# Patient Record
Sex: Male | Born: 1990
Health system: Southern US, Community
[De-identification: ages and names within clinical notes are randomized; demographics above are authoritative.]

## PROBLEM LIST (undated history)

## (undated) DIAGNOSIS — R109 Unspecified abdominal pain: Secondary | ICD-10-CM

## (undated) DIAGNOSIS — K219 Gastro-esophageal reflux disease without esophagitis: Secondary | ICD-10-CM

## (undated) DIAGNOSIS — R002 Palpitations: Secondary | ICD-10-CM

## (undated) DIAGNOSIS — R079 Chest pain, unspecified: Secondary | ICD-10-CM

## (undated) HISTORY — DX: Chest pain, unspecified: R07.9

## (undated) HISTORY — DX: Palpitations: R00.2

## (undated) HISTORY — DX: Unspecified abdominal pain: R10.9

---

## 2010-10-08 ENCOUNTER — Ambulatory Visit
Admission: RE | Admit: 2010-10-08 | Discharge: 2010-10-08 | Disposition: A | Discharge status: Home / Self Care | Payer: BC Managed Care – PPO | Source: Ambulatory Visit | Attending: Family Medicine | Admitting: Family Medicine

## 2010-10-08 ENCOUNTER — Other Ambulatory Visit: Payer: Self-pay | Admitting: Family Medicine

## 2012-08-25 ENCOUNTER — Ambulatory Visit: Payer: BC Managed Care – PPO

## 2012-08-25 ENCOUNTER — Ambulatory Visit (INDEPENDENT_AMBULATORY_CARE_PROVIDER_SITE_OTHER): Payer: BC Managed Care – PPO | Admitting: Family Medicine

## 2012-08-25 VITALS — BP 120/88 | HR 70 | Temp 98.0°F | Resp 16 | Ht 73.0 in | Wt 176.0 lb

## 2012-08-25 DIAGNOSIS — R1031 Right lower quadrant pain: Secondary | ICD-10-CM

## 2012-08-25 DIAGNOSIS — K5900 Constipation, unspecified: Secondary | ICD-10-CM

## 2012-08-25 NOTE — Progress Notes (Signed)
I have reviewed the history and physical exam in detail with Crissie Reese, student PA-C and Porfirio Oar, PA-C.  I reviewed the AAS which revealed no free air but large stool burden. Agree with assessment in plan. If pain persistent or worsens, consider CT abd/pelvis and/or referral to general surgery.

## 2012-08-25 NOTE — Progress Notes (Signed)
I have examined this patient along with the student and agree.  If pain worsens/persists, plan CT scan. Consider non-palpable hernia as cause of discomfort.

## 2012-08-25 NOTE — Progress Notes (Signed)
  Subjective:    Patient ID: Jonathan Spencer, male    DOB: Apr 15, 1990, 22 y.o.   MRN: [id number]  Chief Complaint  Patient presents with  . Abdominal Pain    right lower quadrant x 1.5 weeks    HPI Jonathan Spencer is a 22 y.o. male presenting for "conscious awareness" of gas-like feeling in RLQ x 1 month.  Patient states on July 5th - he noticed a pressure / "like something was in there" in his RLQ.  He describes spending the night before going out and having a few drinks.  For the last 1 1/2 weeks - the "awareness" feeling has been constant.  With certain movements, a sharp type pain is elicited (different from "awareness"). Worst when sitting up in a car.  Also hurts when going to sit up from laying down and when rolling over in bed.  These pains are intermittent.    Denies fever, chills, night sweats, nausea, vomiting, diarrhea, constipation, dysuria, urinary frequency.  He does admit to back pain but says this is an ongoing problem.    No major medical problems.  No surgeries.  No medications or allergies.  Review of Systems As stated in HPI - otherwise negative.    Objective:   Physical Exam Filed Vitals:   08/25/12 1812  BP: 120/88  Pulse: 70  Temp: 98 F (36.7 C)  TempSrc: Oral  Resp: 16  Height: 6\' 1"  (1.854 m)  Weight: 176 lb (79.833 kg)  SpO2: 99%   General:  WDWN male in no acute distress.   Abdomen:  Adequate bowel sounds.  Mild "soreness" to palpation in RLQ.  No masses or hernias appreciated. Genital:  No hernia or tenderness.   X-Ray ready by Dr. Nilda Simmer.  Preliminary Report:  Constipation.  No other abnormalities.    Assessment & Plan:  1. Abdominal pain, RLQ 2. Unspecified constipation  Suspect constipation is the source of abdominal pain.  Start daily miralax regimen.  Call office if symptoms do not steadily improve or any acute worsening.  Obtained:   - DG Abd Acute W/Chest

## 2012-08-25 NOTE — Patient Instructions (Signed)
Your x-rays reveal a large amount of stool on the right side of your colon that may be the cause of your discomfort and pain.   Use miralax - 1 capful in 8 oz of liquid daily. Call office if symptoms do not steadily improve or any acute worsening. You may need additional imaging or referral to specialist.

## 2012-09-13 ENCOUNTER — Ambulatory Visit (INDEPENDENT_AMBULATORY_CARE_PROVIDER_SITE_OTHER): Payer: BC Managed Care – PPO | Admitting: Family Medicine

## 2012-09-13 VITALS — BP 122/83 | HR 84 | Temp 98.6°F | Resp 16 | Ht 74.0 in | Wt 171.0 lb

## 2012-09-13 DIAGNOSIS — H6123 Impacted cerumen, bilateral: Secondary | ICD-10-CM

## 2012-09-13 DIAGNOSIS — J029 Acute pharyngitis, unspecified: Secondary | ICD-10-CM

## 2012-09-13 DIAGNOSIS — H6120 Impacted cerumen, unspecified ear: Secondary | ICD-10-CM

## 2012-09-13 LAB — POCT RAPID STREP A (OFFICE): Rapid Strep A Screen: NEGATIVE

## 2012-09-13 MED ORDER — CEFDINIR 300 MG PO CAPS: 300.0000 mg | ORAL_CAPSULE | Freq: Two times a day (BID) | ORAL | Status: DC

## 2012-09-13 NOTE — Progress Notes (Signed)
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 He's also having some nausea but no vomiting. He's had a fever and swollen glands in the neck. He has no other medical problems.  Objective: No acute distress Oropharynx: Moderately erythematous and swollen posterior pharynx, tm's bilateral cerumen impaction Skin: Unremarkable Neck: Mild anterior cervical adenopathy, supple  Results for orders placed in visit on 09/13/12  POCT RAPID STREP A (OFFICE)      Result Value Range   Rapid Strep A Screen Negative  Negative   Assessment:  Tonsillitis  Plan:  Omnicef Sore throat - Plan: POCT rapid strep A, cefdinir (OMNICEF) 300 MG capsule  Cerumen impaction, bilateral  Signed, Elvina Sidle, MD

## 2013-02-27 ENCOUNTER — Emergency Department (HOSPITAL_COMMUNITY)
Admission: EM | Admit: 2013-02-27 | Discharge: 2013-02-27 | Disposition: A | Discharge status: Home / Self Care | Payer: BC Managed Care – PPO | Attending: Emergency Medicine | Admitting: Emergency Medicine

## 2013-02-27 ENCOUNTER — Emergency Department (HOSPITAL_COMMUNITY): Payer: BC Managed Care – PPO

## 2013-02-27 ENCOUNTER — Encounter (HOSPITAL_COMMUNITY): Payer: Self-pay | Admitting: Emergency Medicine

## 2013-02-27 DIAGNOSIS — R002 Palpitations: Secondary | ICD-10-CM

## 2013-02-27 DIAGNOSIS — R079 Chest pain, unspecified: Secondary | ICD-10-CM

## 2013-02-27 DIAGNOSIS — R0789 Other chest pain: Secondary | ICD-10-CM | POA: Insufficient documentation

## 2013-02-27 DIAGNOSIS — F411 Generalized anxiety disorder: Secondary | ICD-10-CM | POA: Insufficient documentation

## 2013-02-27 LAB — BASIC METABOLIC PANEL
BUN: 11 mg/dL (ref 6–23)
CHLORIDE: 97 meq/L (ref 96–112)
CO2: 26 meq/L (ref 19–32)
CREATININE: 0.63 mg/dL (ref 0.50–1.35)
Calcium: 9.3 mg/dL (ref 8.4–10.5)
GFR calc Af Amer: >90 mL/min (ref >90)
GFR calc non Af Amer: >90 mL/min (ref >90)
Glucose, Bld: 145 mg/dL — ABNORMAL HIGH (ref 70–99)
Potassium: 3.8 mEq/L (ref 3.7–5.3)
Sodium: 137 mEq/L (ref 137–147)

## 2013-02-27 LAB — CBC WITH DIFFERENTIAL/PLATELET
BASOS ABS: 0 10*3/uL (ref 0.0–0.1)
Basophils Relative: 0 % (ref 0–1)
Eosinophils Absolute: 0.3 10*3/uL (ref 0.0–0.7)
Eosinophils Relative: 3 % (ref 0–5)
HEMATOCRIT: 42.7 % (ref 39.0–52.0)
HEMOGLOBIN: 15 g/dL (ref 13.0–17.0)
LYMPHS PCT: 25 % (ref 12–46)
Lymphs Abs: 2.7 10*3/uL (ref 0.7–4.0)
MCH: 30.7 pg (ref 26.0–34.0)
MCHC: 35.1 g/dL (ref 30.0–36.0)
MCV: 87.3 fL (ref 78.0–100.0)
MONO ABS: 0.6 10*3/uL (ref 0.1–1.0)
MONOS PCT: 6 % (ref 3–12)
NEUTROS ABS: 7.1 10*3/uL (ref 1.7–7.7)
Neutrophils Relative %: 66 % (ref 43–77)
Platelets: 246 10*3/uL (ref 150–400)
RBC: 4.89 MIL/uL (ref 4.22–5.81)
RDW: 12.8 % (ref 11.5–15.5)
WBC: 10.7 10*3/uL — AB (ref 4.0–10.5)

## 2013-02-27 LAB — POCT I-STAT TROPONIN I
TROPONIN I, POC: 0 ng/mL (ref 0.00–0.08)
Troponin i, poc: 0 ng/mL (ref 0.00–0.08)

## 2013-02-27 LAB — D-DIMER, QUANTITATIVE (NOT AT ARMC): D DIMER QUANT: 0.27 ug{FEU}/mL (ref 0.00–0.48)

## 2013-02-27 MED ORDER — SODIUM CHLORIDE 0.9 % IV BOLUS (SEPSIS)
1000.0000 mL | Freq: Once | INTRAVENOUS | Status: AC
  Administered 2013-02-27: 1000 mL via INTRAVENOUS

## 2013-02-27 NOTE — ED Provider Notes (Signed)
CSN: [id number]     Arrival date & time 02/27/13  1931 History   First MD Initiated Contact with Patient 02/27/13 1947     Chief Complaint  Patient presents with  . Chest Pain   (Consider location/radiation/quality/duration/timing/severity/associated sxs/prior Treatment) The history is provided by the patient.  Jonathan BetterBenjamin Spencer is a 23 y.o. male otherwise healthy here with chest pain. He was driving today and had substernal chest pain and no radiation. He states that it is a sense of pressure and is associated with some shortness of breath and palpitations. Denies any abdominal pain. Denies any history of blood clots. His mother had MI in the past (she was much older than he is now) but no early death in the family. He is under a lot of stress recently    History reviewed. No pertinent past medical history. History reviewed. No pertinent past surgical history. Family History  Problem Relation Age of Onset  . Heart disease Mother   . Heart disease Maternal Grandmother    History  Substance Use Topics  . Smoking status: Never Smoker   . Smokeless tobacco: Never Used  . Alcohol Use: Yes     Comment: 10-15 per week    Review of Systems  Cardiovascular: Positive for chest pain.  All other systems reviewed and are negative.    Allergies  Review of patient's allergies indicates no known allergies.  Home Medications  No current outpatient prescriptions on file. BP 151/87  Pulse 104  Temp(Src) 98.1 F (36.7 C) (Oral)  Resp 18  SpO2 98% Physical Exam  Nursing note and vitals reviewed. Constitutional: He is oriented to person, place, and time. He appears well-developed and well-nourished.  Slightly anxious   HENT:  Head: Normocephalic.  Mouth/Throat: Oropharynx is clear and moist.  Eyes: Conjunctivae are normal. Pupils are equal, round, and reactive to light.  Neck: Normal range of motion. Neck supple.  Cardiovascular: Regular rhythm and normal heart sounds.   Slightly  tachy  Pulmonary/Chest: Effort normal and breath sounds normal. No respiratory distress. He has no wheezes. He has no rales.  Abdominal: Soft. Bowel sounds are normal. He exhibits no distension. There is no tenderness. There is no rebound and no guarding.  Musculoskeletal: Normal range of motion. He exhibits no edema and no tenderness.  Neurological: He is alert and oriented to person, place, and time.  Skin: Skin is warm and dry.  Psychiatric: He has a normal mood and affect. His behavior is normal. Judgment and thought content normal.    ED Course  Procedures (including critical care time) Labs Review Labs Reviewed  CBC WITH DIFFERENTIAL - Abnormal; Notable for the following:    WBC 10.7 (*)    All other components within normal limits  BASIC METABOLIC PANEL - Abnormal; Notable for the following:    Glucose, Bld 145 (*)    All other components within normal limits  D-DIMER, QUANTITATIVE  POCT I-STAT TROPONIN I  POCT I-STAT TROPONIN I   Imaging Review Dg Chest 2 View  02/27/2013   CLINICAL DATA:  Chest pain.  EXAM: CHEST  2 VIEW  COMPARISON:  08/25/2012  FINDINGS: Lungs are clear. Cardiomediastinal silhouette and remainder of the exam is unchanged.  IMPRESSION: No active cardiopulmonary disease.   Electronically Signed   By: Elberta Fortisaniel  Boyle M.D.   On: 02/27/2013 20:12    EKG Interpretation    Date/Time:  Sunday February 27 2013 19:42:18 EST Ventricular Rate:  107 PR Interval:  126 QRS Duration: 110  QT Interval:  338 QTC Calculation: 451 R Axis:   73 Text Interpretation:  Sinus tachycardia LVH with IVCD and secondary repol abnrm Probable inferior infarct, old No previous ECGs available Confirmed by YAO  MD, DAVID [phone]) on 02/27/2013 7:54:45 PM            MDM  No diagnosis found. Jonathan Spencer is a 23 y.o. male here with chest pain, palpitations. Likely anxiety related. Low risk for ACS so trop x 2 sufficient. Tachy and slightly short of breath so will get d-dimer.  Will reassess.   10:41 PM D-dimer normal. Trop neg x 2. CXR nl. He now tells me that he takes a lot of caffeine. I recommend cut back on caffeine. If he has persistent palpitations, he can see PMD or cardiology for Holter.      Richardean Canal, MD 02/27/13 2242

## 2013-02-27 NOTE — ED Notes (Signed)
Pt arrived to the ED with a complaint of chest pain.  Pt chest pain began today.  Pt states the pain is in the central chest with no radiation.

## 2013-02-27 NOTE — ED Notes (Signed)
Dr Silverio LayYao requested 2nd Troponin level to be drawn at 2200.

## 2013-02-27 NOTE — Discharge Instructions (Signed)
Try to cut back on caffeine.   Stay hydrated.   Take tylenol or motrin for pain.   Follow up with your doctor or cardiologist if you have persistent palpitations.   Return to ER if you have severe chest pain, palpitations.

## 2013-03-01 ENCOUNTER — Encounter: Payer: Self-pay | Admitting: *Deleted

## 2013-03-01 ENCOUNTER — Encounter: Payer: Self-pay | Admitting: Cardiovascular Disease

## 2013-03-01 ENCOUNTER — Ambulatory Visit (INDEPENDENT_AMBULATORY_CARE_PROVIDER_SITE_OTHER): Payer: BC Managed Care – PPO | Admitting: Cardiovascular Disease

## 2013-03-01 VITALS — BP 135/87 | HR 82 | Ht 75.0 in | Wt 182.0 lb

## 2013-03-01 DIAGNOSIS — R079 Chest pain, unspecified: Secondary | ICD-10-CM | POA: Insufficient documentation

## 2013-03-01 DIAGNOSIS — R109 Unspecified abdominal pain: Secondary | ICD-10-CM | POA: Insufficient documentation

## 2013-03-01 DIAGNOSIS — R002 Palpitations: Secondary | ICD-10-CM | POA: Insufficient documentation

## 2013-03-01 NOTE — Progress Notes (Signed)
Patient ID: Jonathan Spencer, male   DOB: 07-Jan-1991, 23 y.o.   MRN: [id number] Jonathan Spencer is a 23 y.o. male otherwise healthy here with chest pain.  Seen in ER two days ago He was driving today and had substernal chest pain and no radiation. He states that it is a sense of pressure and is associated with some shortness of breath and palpitations. Denies any abdominal pain. Denies any history of blood clots. His mother had MI in the past (she was much older than he is now) but no early death in the family. He is under a lot of stress recently   Has a lot of caffeine and energy drinks  No drugs and social ETOH  No brothers and sisters.  Mom had SCAD.  Had initial pain around 2 while driving care EMS evaluated and thought he was ok  Went to ER latter around 6:00 for recurrence     ROS: Denies fever, malais, weight loss, blurry vision, decreased visual acuity, cough, sputum, SOB, hemoptysis, pleuritic pain, palpitaitons, heartburn, abdominal pain, melena, lower extremity edema, claudication, or rash.  All other systems reviewed and negative   General: Affect appropriate Healthy:  appears stated age HEENT: normal Neck supple with no adenopathy JVP normal no bruits no thyromegaly Lungs clear with no wheezing and good diaphragmatic motion Heart:  S1/S2 no murmur,rub, gallop or click PMI normal Abdomen: benighn, BS positve, no tenderness, no AAA no bruit.  No HSM or HJR Distal pulses intact with no bruits No edema Neuro non-focal Skin warm and dry No muscular weakness  Medications No current outpatient prescriptions on file.   No current facility-administered medications for this visit.    Allergies Review of patient's allergies indicates no known allergies.  Family History: Family History  Problem Relation Age of Onset  . Heart disease Mother   . Heart disease Maternal Grandmother     Social History: History   Social History  . Marital Status: Single    Spouse Name: N/A     Number of Children: N/A  . Years of Education: N/A   Occupational History  . car Water quality scientistsalesman     Sales, [employer]    Social History Main Topics  . Smoking status: Never Smoker   . Smokeless tobacco: Never Used  . Alcohol Use: Yes     Comment: 10-15 per week  . Drug Use: No  . Sexual Activity: Yes   Other Topics Concern  . Not on file   Social History Narrative   Lives with cousin     Electrocardiogram:  SR rate 107 normal   Assessment and Plan

## 2013-03-01 NOTE — Assessment & Plan Note (Signed)
Etiology not clear Given age and lack of risk factors not likely cardiac  F/U stress echo

## 2013-03-01 NOTE — Assessment & Plan Note (Signed)
Benign Normal ECG  Related to pain.  No preexcitation.  Observe

## 2013-03-01 NOTE — Patient Instructions (Signed)
Your physician has requested that you have a stress echocardiogram. For further information please visit [email or web address]. Please follow instruction sheet as given.  Your physician recommends that you schedule a follow-up appointment as needed.

## 2013-03-28 ENCOUNTER — Other Ambulatory Visit: Payer: Self-pay | Admitting: *Deleted

## 2013-03-28 DIAGNOSIS — R002 Palpitations: Secondary | ICD-10-CM

## 2013-03-28 DIAGNOSIS — R079 Chest pain, unspecified: Secondary | ICD-10-CM

## 2013-03-29 ENCOUNTER — Other Ambulatory Visit (HOSPITAL_COMMUNITY): Payer: BC Managed Care – PPO

## 2013-04-14 ENCOUNTER — Ambulatory Visit (INDEPENDENT_AMBULATORY_CARE_PROVIDER_SITE_OTHER): Payer: BC Managed Care – PPO | Admitting: Emergency Medicine

## 2013-04-14 VITALS — BP 124/82 | HR 79 | Temp 99.1°F | Resp 16 | Ht 74.0 in | Wt 178.2 lb

## 2013-04-14 DIAGNOSIS — R079 Chest pain, unspecified: Secondary | ICD-10-CM

## 2013-04-14 DIAGNOSIS — K2990 Gastroduodenitis, unspecified, without bleeding: Secondary | ICD-10-CM

## 2013-04-14 DIAGNOSIS — K2970 Gastritis, unspecified, without bleeding: Secondary | ICD-10-CM

## 2013-04-14 LAB — COMPREHENSIVE METABOLIC PANEL
ALK PHOS: 49 U/L (ref 39–117)
ALT: 25 U/L (ref 0–53)
AST: 23 U/L (ref 0–37)
Albumin: 4.9 g/dL (ref 3.5–5.2)
BILIRUBIN TOTAL: 0.7 mg/dL (ref 0.2–1.2)
BUN: 9 mg/dL (ref 6–23)
CALCIUM: 9.4 mg/dL (ref 8.4–10.5)
CHLORIDE: 98 meq/L (ref 96–112)
CO2: 31 mEq/L (ref 19–32)
CREATININE: 0.65 mg/dL (ref 0.50–1.35)
Glucose, Bld: 83 mg/dL (ref 70–99)
Potassium: 4.3 mEq/L (ref 3.5–5.3)
Sodium: 139 mEq/L (ref 135–145)
Total Protein: 7.7 g/dL (ref 6.0–8.3)

## 2013-04-14 LAB — POCT CBC
GRANULOCYTE PERCENT: 74.4 % (ref 37–80)
HCT, POC: 51.8 % (ref 43.5–53.7)
Hemoglobin: 17.1 g/dL (ref 14.1–18.1)
Lymph, poc: 1.5 (ref 0.6–3.4)
MCH, POC: 30.6 pg (ref 27–31.2)
MCHC: 33 g/dL (ref 31.8–35.4)
MCV: 92.9 fL (ref 80–97)
MID (CBC): 0.6 (ref 0–0.9)
MPV: 10.6 fL (ref 0–99.8)
PLATELET COUNT, POC: 278 10*3/uL (ref 142–424)
POC GRANULOCYTE: 5.9 (ref 2–6.9)
POC LYMPH PERCENT: 18.5 %L (ref 10–50)
POC MID %: 7.1 % (ref 0–12)
RBC: 5.58 M/uL (ref 4.69–6.13)
RDW, POC: 13.8 %
WBC: 7.9 10*3/uL (ref 4.6–10.2)

## 2013-04-14 LAB — TSH: TSH: 0.874 u[IU]/mL (ref 0.350–4.500)

## 2013-04-14 MED ORDER — SUCRALFATE 1 G PO TABS: ORAL_TABLET | ORAL | Status: DC

## 2013-04-14 MED ORDER — LANSOPRAZOLE 30 MG PO CPDR: 30.0000 mg | DELAYED_RELEASE_CAPSULE | Freq: Every day | ORAL | Status: DC

## 2013-04-14 NOTE — Progress Notes (Signed)
[facility] [address], [place] [address] [phone]  Date:  04/14/2013   Name:  Jonathan Spencer   DOB:  November 07, 1990   MRN:  [id number]  PCP:  Lolita PatellaEADE,ROBERT ALEXANDER, MD    Chief Complaint: Chest Pain   History of Present Illness:  Jonathan Spencer is a 23 y.o. very pleasant male patient who presents with the following:  Young man with a history of apparent caffeine induced chest pain and palpitations.  Says he had a sustained tachycardia that is not documented in the medical record of either the ED nor the cardiologist office.  He has no risk factors for CV disease.  Has since stopped taking caffeine in any form.  Now to office with recurrence of chest pain.  Says he has pains and they are located in his epigastrium and last short time to half a day.  No associated nausea or vomiting.  No food intolerance.  Today's pain started while he was sitting at his desk at work.  Non radiating.  No nausea or vomiting.  No stool change,  No shortness of breath or wheezing.  No history of PUD.  No improvement with over the counter medications or other home remedies. Denies other complaint or health concern today.   Patient Active Problem List   Diagnosis Date Noted  . Chest pain   . Abdominal pain, unspecified site   . Palpitation     Past Medical History  Diagnosis Date  . Chest pain   . Abdominal pain, unspecified site   . Palpitation     No past surgical history on file.  History  Substance Use Topics  . Smoking status: Current Some Day Smoker  . Smokeless tobacco: Never Used  . Alcohol Use: Yes     Comment: 10-15 per week    Family History  Problem Relation Age of Onset  . Heart disease Mother   . Heart disease Maternal Grandmother     No Known Allergies  Medication list has been reviewed and updated.  No current outpatient prescriptions on file prior to visit.   No current facility-administered medications on file prior to visit.     Review of Systems:  As per HPI, otherwise negative.    Physical Examination: Filed Vitals:   04/14/13 1255  BP: 124/82  Pulse: 79  Temp: 99.1 F (37.3 C)  Resp: 16   Filed Vitals:   04/14/13 1255  Height: 6\' 2"  (1.88 m)  Weight: 178 lb 3.2 oz (80.831 kg)   Body mass index is 22.87 kg/(m^2). Ideal Body Weight: Weight in (lb) to have BMI = 25: 194.3  GEN: WDWN, NAD, Non-toxic, A & O x 3 HEENT: Atraumatic, Normocephalic. Neck supple. No masses, No LAD. Ears and Nose: No external deformity. CV: RRR, No M/G/R. No JVD. No thrill. No extra heart sounds. PULM: CTA B, no wheezes, crackles, rhonchi. No retractions. No resp. distress. No accessory muscle use. ABD: S, NT, ND, +BS. No rebound. No HSM. EXTR: No c/c/e NEURO Normal gait.  PSYCH: Normally interactive. Conversant. Not depressed or anxious appearing.  Calm demeanor.    Assessment and Plan: Chest pain Gastritis Relieved pain with GI cocktail  Signed,  Phillips OdorJeffery Patrina Andreas, MD

## 2013-04-14 NOTE — Patient Instructions (Signed)

## 2013-04-15 LAB — H. PYLORI ANTIBODY, IGG

## 2013-04-26 ENCOUNTER — Other Ambulatory Visit (HOSPITAL_COMMUNITY): Payer: BC Managed Care – PPO

## 2013-04-26 ENCOUNTER — Encounter: Payer: BC Managed Care – PPO | Admitting: Nurse Practitioner

## 2013-06-24 ENCOUNTER — Ambulatory Visit (INDEPENDENT_AMBULATORY_CARE_PROVIDER_SITE_OTHER): Payer: BC Managed Care – PPO | Admitting: Family Medicine

## 2013-06-24 VITALS — BP 130/90 | HR 75 | Temp 98.2°F | Resp 16 | Ht 73.5 in | Wt 170.0 lb

## 2013-06-24 DIAGNOSIS — N4889 Other specified disorders of penis: Secondary | ICD-10-CM

## 2013-06-24 DIAGNOSIS — N489 Disorder of penis, unspecified: Secondary | ICD-10-CM

## 2013-06-24 DIAGNOSIS — K2970 Gastritis, unspecified, without bleeding: Secondary | ICD-10-CM

## 2013-06-24 LAB — POCT CBC
Granulocyte percent: 70 %G (ref 37–80)
HCT, POC: 53.5 % (ref 43.5–53.7)
HEMOGLOBIN: 17.5 g/dL (ref 14.1–18.1)
Lymph, poc: 1.5 (ref 0.6–3.4)
MCH: 30.3 pg (ref 27–31.2)
MCHC: 32.7 g/dL (ref 31.8–35.4)
MCV: 92.5 fL (ref 80–97)
MID (cbc): 0.4 (ref 0–0.9)
MPV: 9.7 fL (ref 0–99.8)
POC Granulocyte: 4.5 (ref 2–6.9)
POC LYMPH PERCENT: 23.8 %L (ref 10–50)
POC MID %: 6.2 %M (ref 0–12)
Platelet Count, POC: 249 10*3/uL (ref 142–424)
RBC: 5.78 M/uL (ref 4.69–6.13)
RDW, POC: 13.1 %
WBC: 6.4 10*3/uL (ref 4.6–10.2)

## 2013-06-24 MED ORDER — AZITHROMYCIN 250 MG PO TABS: ORAL_TABLET | ORAL | Status: DC

## 2013-06-24 NOTE — Progress Notes (Signed)
Subjective:    Patient ID: Jonathan Spencer, male    DOB: 1990/02/25, 23 y.o.   MRN: [id number]  06/24/2013  Penis issue   HPI This 23 y.o. male presents for evaluation of penile bumps.  Same partner x 4 months male partner. Noticed white patch rough on end of penis.   No itching or burning.   No pain.  No STDs.  Only male partners.  Three total partners.  No penile discharge.  No dysuria.  Applied Lotrimin to area once without improvement.    ED evaluation in past six months for epigastric and chest pain; referred to cardiology; [facility]; diagnosed with gastritis; prescribed PPI and carafate; feeling better; completed PPI course; has two remaining weeks of Carafate and plans to d/c.  Feeling well.   Review of Systems  Constitutional: Negative for fever, chills, diaphoresis and fatigue.  Genitourinary: Positive for genital sores. Negative for dysuria, urgency, frequency, discharge, penile swelling, scrotal swelling, penile pain and testicular pain.  Hematological: Negative for adenopathy.    Past Medical History  Diagnosis Date  . Chest pain   . Abdominal pain, unspecified site   . Palpitation   History reviewed. No pertinent past surgical history.  No Known Allergies Current Outpatient Prescriptions  Medication Sig Dispense Refill  . sucralfate (CARAFATE) 1 G tablet 1 tab 1 hr ac and hs  120 tablet  0  . azithromycin (ZITHROMAX) 250 MG tablet 4 tablets daily x 1 day  4 tablet  0  . lansoprazole (PREVACID) 30 MG capsule Take 1 capsule (30 mg total) by mouth daily at 12 noon.  30 capsule  5   No current facility-administered medications for this visit.   History   Social History  . Marital Status: Single    Spouse Name: N/A    Number of Children: N/A  . Years of Education: N/A   Occupational History  . car Water quality scientistsalesman     Sales, Fore[employer]    Social History Main Topics  . Smoking status: Current Some Day Smoker  . Smokeless tobacco: Never Used  .  Alcohol Use: Yes     Comment: 10-15 per week  . Drug Use: No  . Sexual Activity: Yes   Other Topics Concern  . Not on file   Social History Narrative   Lives with cousin    Sexual activity: male partners; 3 total partners; no STD hx.       Objective:    BP 130/90  Pulse 75  Temp(Src) 98.2 F (36.8 C) (Oral)  Resp 16  Ht 6' 1.5" (1.867 m)  Wt 170 lb (77.111 kg)  BMI 22.12 kg/m2  SpO2 99% Physical Exam  Nursing note and vitals reviewed. Constitutional: He appears well-developed and well-nourished. No distress.  Cardiovascular: Normal rate, regular rhythm and normal heart sounds.   No murmur heard. Pulmonary/Chest: Effort normal and breath sounds normal.  Abdominal: Hernia confirmed negative in the right inguinal area and confirmed negative in the left inguinal area.  Genitourinary: Testes normal. Right testis shows no mass, no swelling and no tenderness. Left testis shows no mass, no swelling and no tenderness. Circumcised. No penile erythema or penile tenderness. No discharge found.  Lateral aspect penis mid shaft with 8mm area of cluster of multiple follicular prominence/tiny pustules.  No ulcerative lesions; no vesicles; no erythema; no defined border; no scaling; no streaking.  Lymphadenopathy:       Right: No inguinal adenopathy present.  Left: No inguinal adenopathy present.  Skin: He is not diaphoretic.   Results for orders placed in visit on 06/24/13  POCT CBC      Result Value Ref Range   WBC 6.4  4.6 - 10.2 K/uL   Lymph, poc 1.5  0.6 - 3.4   POC LYMPH PERCENT 23.8  10 - 50 %L   MID (cbc) 0.4  0 - 0.9   POC MID % 6.2  0 - 12 %M   POC Granulocyte 4.5  2 - 6.9   Granulocyte percent 70.0  37 - 80 %G   RBC 5.78  4.69 - 6.13 M/uL   Hemoglobin 17.5  14.1 - 18.1 g/dL   HCT, POC 00.1  74.9 - 53.7 %   MCV 92.5  80 - 97 fL   MCH, POC 30.3  27 - 31.2 pg   MCHC 32.7  31.8 - 35.4 g/dL   RDW, POC 44.9     Platelet Count, POC 249  142 - 424 K/uL   MPV 9.7  0 -  99.8 fL       Assessment & Plan:  Penile lesion - Plan: POCT CBC, RPR, GC/Chlamydia Probe Amp, HIV antibody, Herpes simplex virus culture  1. Penile lesion shaft: New. Send HSV culture, RPR, GC/Chlam, HIV.  HIV counseling provided and verbal consent obtained.  Treat empirically with Zithromax.  Consider Doxycycline if studies all negative and lesion persistent. 2. Gastritis: improved.    Meds ordered this encounter  Medications  . azithromycin (ZITHROMAX) 250 MG tablet    Sig: 4 tablets daily x 1 day    Dispense:  4 tablet    Refill:  0    No Follow-up on file.   Nilda Simmer, M.D.  [facility] [address] [place], [place]  [address] [phone] phone [phone] fax

## 2013-06-25 LAB — HIV ANTIBODY (ROUTINE TESTING W REFLEX): HIV 1&2 Ab, 4th Generation: NONREACTIVE

## 2013-06-25 LAB — GC/CHLAMYDIA PROBE AMP
CT PROBE, AMP APTIMA: NEGATIVE
GC PROBE AMP APTIMA: NEGATIVE

## 2013-06-25 LAB — RPR

## 2013-06-28 LAB — HERPES SIMPLEX VIRUS CULTURE: Organism ID, Bacteria: NOT DETECTED

## 2013-06-30 MED ORDER — DOXYCYCLINE HYCLATE 100 MG PO CAPS: 100.0000 mg | ORAL_CAPSULE | Freq: Two times a day (BID) | ORAL | Status: DC

## 2013-06-30 NOTE — Addendum Note (Signed)
Addended by: Ethelda Chick on: 06/30/2013 07:45 PM   Modules accepted: Orders

## 2013-08-03 ENCOUNTER — Ambulatory Visit (INDEPENDENT_AMBULATORY_CARE_PROVIDER_SITE_OTHER): Payer: BC Managed Care – PPO | Admitting: Family Medicine

## 2013-08-03 ENCOUNTER — Ambulatory Visit (INDEPENDENT_AMBULATORY_CARE_PROVIDER_SITE_OTHER): Payer: BC Managed Care – PPO

## 2013-08-03 VITALS — BP 134/83 | HR 71 | Temp 97.7°F | Resp 18 | Ht 74.0 in | Wt 176.0 lb

## 2013-08-03 DIAGNOSIS — M79641 Pain in right hand: Secondary | ICD-10-CM

## 2013-08-03 DIAGNOSIS — M79609 Pain in unspecified limb: Secondary | ICD-10-CM

## 2013-08-03 DIAGNOSIS — S6010XA Contusion of unspecified finger with damage to nail, initial encounter: Secondary | ICD-10-CM

## 2013-08-03 DIAGNOSIS — S6000XA Contusion of unspecified finger without damage to nail, initial encounter: Secondary | ICD-10-CM

## 2013-08-03 NOTE — Progress Notes (Signed)
[facility] [address], [place] [address] [phone]  Date:  08/03/2013   Name:  Jonathan Spencer   DOB:  1990-05-11   MRN:  [id number]  PCP:  Lolita PatellaEADE,ROBERT ALEXANDER, MD    Chief Complaint: Hand Injury   History of Present Illness:  Jonathan Spencer is a 23 y.o. very pleasant male patient who presents with the following:  Generally healthy young man here today with an injury to his right thumb.  He shut it in a car door a couple of days ago. Otherwise he is doing well today and has no other injury or concern. The door "shut completely" on his distal thumb.  The thumb is painful and the nail has a sugungal hematoma which he would like to drain if possible.  He is right handed  Patient Active Problem List   Diagnosis Date Noted  . Chest pain   . Abdominal pain, unspecified site   . Palpitation     Past Medical History  Diagnosis Date  . Chest pain   . Abdominal pain, unspecified site   . Palpitation     History reviewed. No pertinent past surgical history.  History  Substance Use Topics  . Smoking status: Current Some Day Smoker  . Smokeless tobacco: Never Used  . Alcohol Use: Yes     Comment: 10-15 per week    Family History  Problem Relation Age of Onset  . Heart disease Mother   . Heart disease Maternal Grandmother     No Known Allergies  Medication list has been reviewed and updated.  Current Outpatient Prescriptions on File Prior to Visit  Medication Sig Dispense Refill  . lansoprazole (PREVACID) 30 MG capsule Take 1 capsule (30 mg total) by mouth daily at 12 noon.  30 capsule  5  . azithromycin (ZITHROMAX) 250 MG tablet 4 tablets daily x 1 day  4 tablet  0  . doxycycline (VIBRAMYCIN) 100 MG capsule Take 1 capsule (100 mg total) by mouth 2 (two) times daily.  20 capsule  0  . sucralfate (CARAFATE) 1 G tablet 1 tab 1 hr ac and hs  120 tablet  0   No current facility-administered medications on file prior to visit.    Review  of Systems:  As per HPI- otherwise negative.   Physical Examination: Filed Vitals:   08/03/13 0826  BP: 134/83  Pulse: 71  Temp: 97.7 F (36.5 C)  Resp: 18   Filed Vitals:   08/03/13 0826  Height: 6\' 2"  (1.88 m)  Weight: 176 lb (79.833 kg)   Body mass index is 22.59 kg/(m^2). Ideal Body Weight: Weight in (lb) to have BMI = 25: 194.3  GEN: WDWN, NAD, Non-toxic, A & O x 3, looks well HEENT: Atraumatic, Normocephalic. Neck supple. No masses, No LAD. Ears and Nose: No external deformity. CV: RRR, No M/G/R. No JVD. No thrill. No extra heart sounds. PULM: CTA B, no wheezes, crackles, rhonchi. No retractions. No resp. distress. No accessory muscle use. EXTR: No c/c/e NEURO Normal gait.  PSYCH: Normally interactive. Conversant. Not depressed or anxious appearing.  Calm demeanor.  Right hand; the distal phalanx of the thumb is contused and there is a 90% subungual hematoma of the nail.  Movement of the IP joint is uncomfortable.  No laceration   [facility] reading (PRIMARY) by  Dr. Patsy Lageropland. Right hand: no fracture RIGHT HAND - COMPLETE 3+ VIEW  COMPARISON: None.  FINDINGS: There is no evidence of fracture or dislocation.  There is no evidence of arthropathy or other focal bone abnormality. Soft tissues are unremarkable.  IMPRESSION: Negative.  VC obtained. Nail prepped with betadine, electocautery used to evacuate hematoma from under nail.  Bandaged and placed in a fold- over splint for protection  Assessment and Plan: Right hand pain - Plan: DG Hand Complete Right  Subungual hematoma of digit of hand, initial encounter  Treated with evacuation as above, splint for protection. He will follow-up as needed.  Counseled that nail may become loose and troublesome- if he would like to have it removed we are glad to do this for him as needed  Signed Abbe AmsterdamJessica Lesli Issa, MD

## 2013-08-03 NOTE — Patient Instructions (Signed)
Keep your finger bandaged and splinted until tomorrow.  You can then wash your hand as you normally would, pat dry and keep covered. Use the splint as needed for a few days to protect the nail for further trauma.  Your nail may or may not eventually come off.  If it is getting loose and you want it removed come back and we can do this for you.  However for now I would try and keep it in place to protect the nail bed and allow a new nail to grow.   If you have any problems let us know!

## 2013-09-05 ENCOUNTER — Ambulatory Visit (INDEPENDENT_AMBULATORY_CARE_PROVIDER_SITE_OTHER): Payer: BC Managed Care – PPO | Admitting: Family Medicine

## 2013-09-05 VITALS — BP 132/90 | HR 74 | Temp 98.3°F | Resp 20 | Ht 74.0 in | Wt 170.0 lb

## 2013-09-05 DIAGNOSIS — Z8719 Personal history of other diseases of the digestive system: Secondary | ICD-10-CM

## 2013-09-05 DIAGNOSIS — F411 Generalized anxiety disorder: Secondary | ICD-10-CM

## 2013-09-05 DIAGNOSIS — R079 Chest pain, unspecified: Secondary | ICD-10-CM

## 2013-09-05 MED ORDER — ALPRAZOLAM 0.25 MG PO TABS
0.2500 mg | ORAL_TABLET | Freq: Once | ORAL | Status: AC
  Administered 2013-09-05: 0.25 mg via ORAL

## 2013-09-05 MED ORDER — ALPRAZOLAM 0.25 MG PO TABS: ORAL_TABLET | ORAL | Status: DC

## 2013-09-05 NOTE — Progress Notes (Signed)
Subjective: Patient was at work at the [employer] where he works. He was inside on the phone. He was not typically stressed or anxious. He started having a tingly sensation in his chest, maybe a little fluttery. He has a history of having some little palpitations from time to time. He's been evaluated in the past for chest pain with an EKG which is normal. He did not have any vomiting. He did have a clamminess and sweatiness of his hands and numbness of his hands. Was breathing a little short.  Last evaluated about 4 months ago for similar and was normal. Has not had any tests other than just an EKG. He has a history of GERD, and the frequency of that discomfort has diminished with taking medication.  Objective: Anxious but no acute distress. He was flipping his phone when I saw him. Neck supple and nontender. Chest clear. Heart regular without murmurs.  Assessment: Chest pain, somewhat atypical  Plan: EKG normal  Discuss his symptoms with him a little bit further. He feels that once he starts feeling something in his chest he thinks he may get more anxious. He really wasn't having any great anxiety when it hit a day however. I am going to go ahead and give him 25 of Xanax. I think he does need to see his cardiologist back. He never did get the stress echo done that was previously ordered. I am going to recommend that he get evaluated a little bit further to be at a higher level of confidence that he is not having any problems from his heart, but this does not seem cardiac at this time.

## 2013-09-05 NOTE — Patient Instructions (Signed)
If you start having the palpitations sensation in your care or tingling there take a alprazolam and try to relax.  If symptoms are getting worse see a physician promptly or call 911 if needed.  Referral will be made back to your cardiologist. If you call the office and get yourself an appointment before you hear from us please call back and speak to the referrals secretary and inform them that you already have an appointment.

## 2013-09-06 ENCOUNTER — Encounter: Payer: Self-pay | Admitting: Family Medicine

## 2013-11-02 ENCOUNTER — Institutional Professional Consult (permissible substitution): Payer: BC Managed Care – PPO | Admitting: Cardiovascular Disease

## 2013-11-11 ENCOUNTER — Telehealth: Payer: Self-pay | Admitting: Family Medicine

## 2013-11-11 NOTE — Telephone Encounter (Signed)
We received the referral for Mr. Jonathan Spencer to see Dr. Eden EmmsNishan.  He was schedule 11-02-13 and he no showed for the appointment.  Spoke with him today and he don't wish to see us at this time.

## 2013-11-16 ENCOUNTER — Encounter: Payer: Self-pay | Admitting: Cardiovascular Disease

## 2014-05-11 ENCOUNTER — Ambulatory Visit (INDEPENDENT_AMBULATORY_CARE_PROVIDER_SITE_OTHER): Payer: Managed Care, Other (non HMO) | Admitting: Family Medicine

## 2014-05-11 VITALS — BP 118/74 | HR 77 | Temp 98.3°F | Resp 16 | Ht 73.5 in | Wt 194.0 lb

## 2014-05-11 DIAGNOSIS — R1033 Periumbilical pain: Secondary | ICD-10-CM | POA: Diagnosis not present

## 2014-05-11 DIAGNOSIS — R1031 Right lower quadrant pain: Secondary | ICD-10-CM

## 2014-05-11 DIAGNOSIS — R8281 Pyuria: Secondary | ICD-10-CM

## 2014-05-11 DIAGNOSIS — N390 Urinary tract infection, site not specified: Secondary | ICD-10-CM | POA: Diagnosis not present

## 2014-05-11 DIAGNOSIS — R1011 Right upper quadrant pain: Secondary | ICD-10-CM | POA: Diagnosis not present

## 2014-05-11 LAB — POCT URINALYSIS DIPSTICK
Bilirubin, UA: NEGATIVE
Glucose, UA: NEGATIVE
Ketones, UA: NEGATIVE
NITRITE UA: NEGATIVE
PROTEIN UA: NEGATIVE
RBC UA: NEGATIVE
Spec Grav, UA: 1.015
UROBILINOGEN UA: 0.2
pH, UA: 8

## 2014-05-11 LAB — POCT UA - MICROSCOPIC ONLY
BACTERIA, U MICROSCOPIC: NEGATIVE
CRYSTALS, UR, HPF, POC: NEGATIVE
Casts, Ur, LPF, POC: NEGATIVE
Mucus, UA: NEGATIVE
YEAST UA: NEGATIVE

## 2014-05-11 LAB — POCT CBC
GRANULOCYTE PERCENT: 70.8 % (ref 37–80)
HCT, POC: 50.2 % (ref 43.5–53.7)
Hemoglobin: 16.7 g/dL (ref 14.1–18.1)
Lymph, poc: 2 (ref 0.6–3.4)
MCH, POC: 28.8 pg (ref 27–31.2)
MCHC: 33.3 g/dL (ref 31.8–35.4)
MCV: 86.3 fL (ref 80–97)
MID (CBC): 0.6 (ref 0–0.9)
MPV: 8.3 fL (ref 0–99.8)
POC Granulocyte: 6.3 (ref 2–6.9)
POC LYMPH %: 22.9 % (ref 10–50)
POC MID %: 6.3 %M (ref 0–12)
Platelet Count, POC: 218 10*3/uL (ref 142–424)
RBC: 5.81 M/uL (ref 4.69–6.13)
RDW, POC: 13.3 %
WBC: 8.9 10*3/uL (ref 4.6–10.2)

## 2014-05-11 NOTE — Progress Notes (Addendum)
Subjective:  This chart was scribed for Meredith Staggers, MD by Richarda Overlie, Medical scribe. This patient was seen in ROOM 4 and the patient's care was started 10:30 AM.  Patient ID: Jonathan Spencer, male    DOB: 17-Jan-1991, 24 y.o.   MRN: [id number]   Chief Complaint  Patient presents with  . Abdominal Pain    sun pm   HPI  HPI Comments: Jonathan Spencer is a 24 y.o. male with a history of CP, GERD, abdominal pain and palpitation who presents to [facility] complaining of abdominal pain that started yesterday at 4PM. He states he has a sharp pain over his belly button and has intermittent soreness in his RLQ. He reports that he experienced some sharp pain in his RLQ last night but says it is currently a dull pain. Pt states that his pain is not present at rest but presents with movement. He states his last BM was yesterday morning and was normal with no pain. Pt denies any recent international travel. He denies eating any undercooked foods recently. Pt reports no history of kidney stones or STDs. He states his last meal was last night at 8PM. Pt denies diarrhea, nausea, vomiting, blood in his stool, penile rash or discharge.   Patient Active Problem List   Diagnosis Date Noted  . Chest pain   . Abdominal pain, unspecified site   . Palpitation    Past Medical History  Diagnosis Date  . Chest pain   . Abdominal pain, unspecified site   . Palpitation    History reviewed. No pertinent past surgical history. No Known Allergies Prior to Admission medications   Medication Sig Start Date End Date Taking? Authorizing Provider  ALPRAZolam Prudy Feeler) 0.25 MG tablet Take one twice daily only if needed for palpitations or extreme anxiety. Patient not taking: Reported on 05/11/2014 09/05/13   Peyton Najjar, MD  lansoprazole (PREVACID) 30 MG capsule Take 1 capsule (30 mg total) by mouth daily at 12 noon. Patient not taking: Reported on 05/11/2014 04/14/13   Carmelina Dane, MD  sucralfate (CARAFATE) 1 G  tablet 1 tab 1 hr ac and hs Patient not taking: Reported on 05/11/2014 04/14/13   Carmelina Dane, MD   History   Social History  . Marital Status: Single    Spouse Name: N/A  . Number of Children: N/A  . Years of Education: N/A   Occupational History  . car Water quality scientist, Forensic psychologist    Social History Main Topics  . Smoking status: Former Games developer  . Smokeless tobacco: Never Used  . Alcohol Use: 0.0 oz/week    0 Standard drinks or equivalent per week     Comment: 10-15 per week  . Drug Use: No  . Sexual Activity: Yes   Other Topics Concern  . Not on file   Social History Narrative   Lives with cousin    Sexual activity: male partners; 3 total partners; no STD hx.   Review of Systems  Gastrointestinal: Positive for abdominal pain. Negative for nausea, vomiting, diarrhea, constipation and blood in stool.  Genitourinary: Negative for discharge and penile swelling.      Objective:   Physical Exam  Constitutional: He is oriented to person, place, and time. He appears well-developed and well-nourished.  HENT:  Head: Normocephalic and atraumatic.  Eyes: Right eye exhibits no discharge. Left eye exhibits no discharge.  Neck: Neck supple. No tracheal deviation present.  Cardiovascular: Normal rate.   Pulmonary/Chest:  Effort normal. No respiratory distress.  Abdominal: Soft. He exhibits no distension. There is tenderness. There is no rebound and no guarding.  Most tender at periumbilical, slightly tender in RLQ most at McBurney's point. Negative heel jar. Negative Murphy's sign.   Neurological: He is alert and oriented to person, place, and time.  Skin: Skin is warm and dry.  Psychiatric: He has a normal mood and affect. His behavior is normal.  Nursing note and vitals reviewed.  Filed Vitals:   05/11/14 0918  BP: 118/74  Pulse: 77  Temp: 98.3 F (36.8 C)  TempSrc: Oral  Resp: 16  Height: 6' 1.5" (1.867 m)  Weight: 194 lb (87.998 kg)  SpO2: 97%    Results for orders placed or performed in visit on 05/11/14  POCT CBC  Result Value Ref Range   WBC 8.9 4.6 - 10.2 K/uL   Lymph, poc 2.0 0.6 - 3.4   POC LYMPH PERCENT 22.9 10 - 50 %L   MID (cbc) 0.6 0 - 0.9   POC MID % 6.3 0 - 12 %M   POC Granulocyte 6.3 2 - 6.9   Granulocyte percent 70.8 37 - 80 %G   RBC 5.81 4.69 - 6.13 M/uL   Hemoglobin 16.7 14.1 - 18.1 g/dL   HCT, POC 78.2 95.6 - 53.7 %   MCV 86.3 80 - 97 fL   MCH, POC 28.8 27 - 31.2 pg   MCHC 33.3 31.8 - 35.4 g/dL   RDW, POC 21.3 %   Platelet Count, POC 218 142 - 424 K/uL   MPV 8.3 0 - 99.8 fL  POCT UA - Microscopic Only  Result Value Ref Range   WBC, Ur, HPF, POC 0-3    RBC, urine, microscopic 0-1    Bacteria, U Microscopic neg    Mucus, UA neg    Epithelial cells, urine per micros 0-1    Crystals, Ur, HPF, POC neg    Casts, Ur, LPF, POC neg    Yeast, UA neg    Renal tubular cells 0-1   POCT urinalysis dipstick  Result Value Ref Range   Color, UA yellow    Clarity, UA clear    Glucose, UA neg    Bilirubin, UA neg    Ketones, UA neg    Spec Grav, UA 1.015    Blood, UA neg    pH, UA 8.0    Protein, UA neg    Urobilinogen, UA 0.2    Nitrite, UA neg    Leukocytes, UA Trace        Assessment & Plan:  Jonathan Spencer is a 24 y.o. male Abdominal pain, RLQ - Plan: POCT CBC, POCT UA - Microscopic Only, POCT urinalysis dipstick, Urine culture  Umbilical pain - Plan: Urine culture  Pyuria - Plan: PSA, Urine culture   -Less than 24 hours of periumbilical abd pain with some radiation to RLQ. Min ttp on exam, afebrile, no N/V, no leukocytosis at this point. Discussed options of eval with CT, but decided to watch for 24 hours and recheck exam and CBC at that time. If increasing pain overnight - advised to go to ER.   -psa and urine cx obtained with few WBC on micro and trace LE, but no urinary sx's.  abx's deferred at this time.   -rtc precautions.    No orders of the defined types were placed in this  encounter.   Patient Instructions  We will check a prostate test and other urine test  for infection. Blood counts are normal today - less likelyappendicitis, but due to area of soreness - return for recheck tomorrow morning. Return to the clinic or go to the nearest emergency room if any of your symptoms worsen or new symptoms occur.  Abdominal Pain Many things can cause abdominal pain. Usually, abdominal pain is not caused by a disease and will improve without treatment. It can often be observed and treated at home. Your health care provider will do a physical exam and possibly order blood tests and X-rays to help determine the seriousness of your pain. However, in many cases, more time must pass before a clear cause of the pain can be found. Before that point, your health care provider may not know if you need more testing or further treatment. HOME CARE INSTRUCTIONS  Monitor your abdominal pain for any changes. The following actions may help to alleviate any discomfort you are experiencing:  Only take over-the-counter or prescription medicines as directed by your health care provider.  Do not take laxatives unless directed to do so by your health care provider.  Try a clear liquid diet (broth, tea, or water) as directed by your health care provider. Slowly move to a bland diet as tolerated. SEEK MEDICAL CARE IF:  You have unexplained abdominal pain.  You have abdominal pain associated with nausea or diarrhea.  You have pain when you urinate or have a bowel movement.  You experience abdominal pain that wakes you in the night.  You have abdominal pain that is worsened or improved by eating food.  You have abdominal pain that is worsened with eating fatty foods.  You have a fever. SEEK IMMEDIATE MEDICAL CARE IF:   Your pain does not go away within 2 hours.  You keep throwing up (vomiting).  Your pain is felt only in portions of the abdomen, such as the right side or the left lower  portion of the abdomen.  You pass bloody or black tarry stools. MAKE SURE YOU:  Understand these instructions.   Will watch your condition.   Will get help right away if you are not doing well or get worse.  Document Released: 10/30/2004 Document Revised: 01/25/2013 Document Reviewed: 09/29/2012 [facility] Patient Information 2015 [place], [place]. This information is not intended to replace advice given to you by your health care provider. Make sure you discuss any questions you have with your health care provider.     I personally performed the services described in this documentation, which was scribed in my presence. The recorded information has been reviewed and considered, and addended by me as needed.

## 2014-05-11 NOTE — Patient Instructions (Addendum)
We will check a prostate test and other urine test for infection. Blood counts are normal today - less likelyappendicitis, but due to area of soreness - return for recheck tomorrow morning. Return to the clinic or go to the nearest emergency room if any of your symptoms worsen or new symptoms occur.  Abdominal Pain Many things can cause abdominal pain. Usually, abdominal pain is not caused by a disease and will improve without treatment. It can often be observed and treated at home. Your health care provider will do a physical exam and possibly order blood tests and X-rays to help determine the seriousness of your pain. However, in many cases, more time must pass before a clear cause of the pain can be found. Before that point, your health care provider may not know if you need more testing or further treatment. HOME CARE INSTRUCTIONS  Monitor your abdominal pain for any changes. The following actions may help to alleviate any discomfort you are experiencing:  Only take over-the-counter or prescription medicines as directed by your health care provider.  Do not take laxatives unless directed to do so by your health care provider.  Try a clear liquid diet (broth, tea, or water) as directed by your health care provider. Slowly move to a bland diet as tolerated. SEEK MEDICAL CARE IF:  You have unexplained abdominal pain.  You have abdominal pain associated with nausea or diarrhea.  You have pain when you urinate or have a bowel movement.  You experience abdominal pain that wakes you in the night.  You have abdominal pain that is worsened or improved by eating food.  You have abdominal pain that is worsened with eating fatty foods.  You have a fever. SEEK IMMEDIATE MEDICAL CARE IF:   Your pain does not go away within 2 hours.  You keep throwing up (vomiting).  Your pain is felt only in portions of the abdomen, such as the right side or the left lower portion of the abdomen.  You pass  bloody or black tarry stools. MAKE SURE YOU:  Understand these instructions.   Will watch your condition.   Will get help right away if you are not doing well or get worse.  Document Released: 10/30/2004 Document Revised: 01/25/2013 Document Reviewed: 09/29/2012 [facility]ExitCare Patient Information 2015 [place], [place]. This information is not intended to replace advice given to you by your health care provider. Make sure you discuss any questions you have with your health care provider.

## 2014-05-12 LAB — URINE CULTURE: Colony Count: 10000

## 2014-05-12 LAB — PSA: PSA: 0.38 ng/mL (ref <=4.00)

## 2014-05-14 ENCOUNTER — Encounter (HOSPITAL_BASED_OUTPATIENT_CLINIC_OR_DEPARTMENT_OTHER): Payer: Self-pay | Admitting: *Deleted

## 2014-05-14 ENCOUNTER — Emergency Department (HOSPITAL_BASED_OUTPATIENT_CLINIC_OR_DEPARTMENT_OTHER)
Admission: EM | Admit: 2014-05-14 | Discharge: 2014-05-15 | Disposition: A | Discharge status: Home / Self Care | Payer: Managed Care, Other (non HMO) | Attending: Emergency Medicine | Admitting: Emergency Medicine

## 2014-05-14 DIAGNOSIS — K5904 Chronic idiopathic constipation: Secondary | ICD-10-CM

## 2014-05-14 DIAGNOSIS — K219 Gastro-esophageal reflux disease without esophagitis: Secondary | ICD-10-CM | POA: Diagnosis not present

## 2014-05-14 DIAGNOSIS — Z79899 Other long term (current) drug therapy: Secondary | ICD-10-CM | POA: Diagnosis not present

## 2014-05-14 DIAGNOSIS — Z87891 Personal history of nicotine dependence: Secondary | ICD-10-CM | POA: Diagnosis not present

## 2014-05-14 DIAGNOSIS — R110 Nausea: Secondary | ICD-10-CM | POA: Diagnosis not present

## 2014-05-14 DIAGNOSIS — R109 Unspecified abdominal pain: Secondary | ICD-10-CM | POA: Diagnosis present

## 2014-05-14 DIAGNOSIS — K5900 Constipation, unspecified: Secondary | ICD-10-CM | POA: Insufficient documentation

## 2014-05-14 HISTORY — DX: Gastro-esophageal reflux disease without esophagitis: K21.9

## 2014-05-14 LAB — URINALYSIS, ROUTINE W REFLEX MICROSCOPIC
Bilirubin Urine: NEGATIVE
Glucose, UA: NEGATIVE mg/dL
HGB URINE DIPSTICK: NEGATIVE
Ketones, ur: NEGATIVE mg/dL
LEUKOCYTES UA: NEGATIVE
NITRITE: NEGATIVE
Protein, ur: NEGATIVE mg/dL
Specific Gravity, Urine: 1.017 (ref 1.005–1.030)
Urobilinogen, UA: 0.2 mg/dL (ref 0.0–1.0)
pH: 6.5 (ref 5.0–8.0)

## 2014-05-14 NOTE — ED Provider Notes (Signed)
CSN: [id number]     Arrival date & time 05/14/14  2236 History  This chart was scribed for Tanyia Grabbe, MD by Tonye Royalty, ED Scribe. This patient was seen in room MH07/MH07 and the patient's care was started at 12:45 AM.    Chief Complaint  Patient presents with  . Abdominal Pain   Patient is a 24 y.o. male presenting with abdominal pain. The history is provided by the patient. No language interpreter was used.  Abdominal Pain Pain location:  R flank Pain quality: sharp   Pain severity:  Moderate Onset quality:  Sudden Duration:  2 days Timing:  Constant Progression:  Waxing and waning Chronicity:  New Context: not alcohol use (last 1 week ago) and not sick contacts   Relieved by:  Nothing Worsened by:  Eating and position changes Ineffective treatments:  None tried Associated symptoms: nausea   Associated symptoms: no chest pain, no constipation, no diarrhea, no fever and no vomiting   Risk factors: has not had multiple surgeries     HPI Comments: Child Campoy is a 23 y.o. male who presents to the Emergency Department complaining of right flank (mix axillary line) onset 2 days ago. He states pain is waxing and waning. He states it is worse after eating and with movement. He was seen at urgent care where he had UA and blood work that revealed some WBC in urine and did not receive a diagnosis. He denies new exercise regimen or particular exertion. Significant other states he has had associated nausea. He has history of GERD but is not compliant with prescription of Prilosec. He uses alcohol occasionally, last 1 week ago. He does not smoke but uses chewing tobacco. He denies diarrhea or constipation.   Past Medical History  Diagnosis Date  . Chest pain   . Abdominal pain, unspecified site   . Palpitation   . Acid reflux    History reviewed. No pertinent past surgical history. Family History  Problem Relation Age of Onset  . Heart disease Mother   . Heart disease Maternal  Grandmother    History  Substance Use Topics  . Smoking status: Former Games developer  . Smokeless tobacco: Current User    Types: Snuff  . Alcohol Use: 0.0 oz/week    0 Standard drinks or equivalent per week     Comment: 10-15 per week    Review of Systems  Constitutional: Negative for fever.  Cardiovascular: Negative for chest pain.  Gastrointestinal: Positive for nausea and abdominal pain. Negative for vomiting, diarrhea and constipation.  All other systems reviewed and are negative.     Allergies  Review of patient's allergies indicates no known allergies.  Home Medications   Prior to Admission medications   Medication Sig Start Date End Date Taking? Authorizing Provider  omeprazole (PRILOSEC OTC) 20 MG tablet Take 20 mg by mouth daily.   Yes Historical Provider, MD   There were no vitals taken for this visit. Physical Exam  Constitutional: He is oriented to person, place, and time. He appears well-developed and well-nourished.  HENT:  Head: Normocephalic and atraumatic.  Mouth/Throat: Oropharynx is clear and moist. No oropharyngeal exudate.  Eyes: Conjunctivae and EOM are normal. Pupils are equal, round, and reactive to light.  Neck: Normal range of motion. Neck supple.  Cardiovascular: Normal rate and regular rhythm.   Pulmonary/Chest: Effort normal and breath sounds normal. No respiratory distress. He has no wheezes. He has no rales.  Abdominal: Soft. He exhibits no distension and no  mass. There is no hepatosplenomegaly. There is no tenderness. There is no rigidity, no rebound, no guarding, no tenderness at McBurney's point and negative Murphy's sign. No hernia.    Exceptionally gassy everywhere   Musculoskeletal: Normal range of motion.  Neurological: He is alert and oriented to person, place, and time.  Skin: Skin is warm and dry.  Psychiatric: He has a normal mood and affect.  Nursing note and vitals reviewed.   ED Course  Procedures (including critical care  time)  DIAGNOSTIC STUDIES: Oxygen Saturation is 97% on room air, normal by my interpretation.    COORDINATION OF CARE: 12:50 AM Discussed treatment plan with patient at beside, the patient agrees with the plan and has no further questions at this time.   Labs Review Labs Reviewed  URINALYSIS, ROUTINE W REFLEX MICROSCOPIC    Imaging Review No results found.   EKG Interpretation None      MDM   Final diagnoses:  None    Results for orders placed or performed during the hospital encounter of 05/14/14  Urinalysis, Routine w reflex microscopic  Result Value Ref Range   Color, Urine YELLOW YELLOW   APPearance CLEAR CLEAR   Specific Gravity, Urine 1.017 1.005 - 1.030   pH 6.5 5.0 - 8.0   Glucose, UA NEGATIVE NEGATIVE mg/dL   Hgb urine dipstick NEGATIVE NEGATIVE   Bilirubin Urine NEGATIVE NEGATIVE   Ketones, ur NEGATIVE NEGATIVE mg/dL   Protein, ur NEGATIVE NEGATIVE mg/dL   Urobilinogen, UA 0.2 0.0 - 1.0 mg/dL   Nitrite NEGATIVE NEGATIVE   Leukocytes, UA NEGATIVE NEGATIVE  CBC with Differential/Platelet  Result Value Ref Range   WBC 9.2 4.0 - 10.5 K/uL   RBC 5.31 4.22 - 5.81 MIL/uL   Hemoglobin 15.3 13.0 - 17.0 g/dL   HCT 40.9 81.1 - 91.4 %   MCV 86.3 78.0 - 100.0 fL   MCH 28.8 26.0 - 34.0 pg   MCHC 33.4 30.0 - 36.0 g/dL   RDW 78.2 95.6 - 21.3 %   Platelets 221 150 - 400 K/uL   Neutrophils Relative % 54 43 - 77 %   Neutro Abs 5.0 1.7 - 7.7 K/uL   Lymphocytes Relative 32 12 - 46 %   Lymphs Abs 2.9 0.7 - 4.0 K/uL   Monocytes Relative 8 3 - 12 %   Monocytes Absolute 0.8 0.1 - 1.0 K/uL   Eosinophils Relative 5 0 - 5 %   Eosinophils Absolute 0.4 0.0 - 0.7 K/uL   Basophils Relative 1 0 - 1 %   Basophils Absolute 0.1 0.0 - 0.1 K/uL  Comprehensive metabolic panel  Result Value Ref Range   Sodium 139 135 - 145 mmol/L   Potassium 3.5 3.5 - 5.1 mmol/L   Chloride 104 96 - 112 mmol/L   CO2 27 19 - 32 mmol/L   Glucose, Bld 120 (H) 70 - 99 mg/dL   BUN 14 6 - 23 mg/dL    Creatinine, Ser 0.86 0.50 - 1.35 mg/dL   Calcium 8.7 8.4 - 57.8 mg/dL   Total Protein 7.1 6.0 - 8.3 g/dL   Albumin 4.3 3.5 - 5.2 g/dL   AST 27 0 - 37 U/L   ALT 34 0 - 53 U/L   Alkaline Phosphatase 43 39 - 117 U/L   Total Bilirubin 0.1 (L) 0.3 - 1.2 mg/dL   GFR calc non Af Amer >90 >90 mL/min   GFR calc Af Amer >90 >90 mL/min   Anion gap 8 5 -  15  Lipase, blood  Result Value Ref Range   Lipase 33 11 - 59 U/L   Dg Abd Acute W/chest  05/15/2014   CLINICAL DATA:  Right upper quadrant abdominal pain radiating to the back for 2 days. Pain worse with eating and movement.  EXAM: DG ABDOMEN ACUTE W/ 1V CHEST  COMPARISON:  None.  FINDINGS: The cardiomediastinal contours are normal. The lungs are clear. There is no free intra-abdominal air. No dilated bowel loops to suggest obstruction. Moderate volume of stool throughout the entire colon. No radiopaque calculi. Left pelvic phlebolith is noted. No acute osseous abnormalities are seen.  IMPRESSION: Moderate volume of retained stool can be seen in the setting of constipation. No bowel obstruction. No free air.   Electronically Signed   By: Rubye OaksMelanie  Ehinger M.D.   On: 05/15/2014 03:03   Constipation with likely cramping and gas.  Recommend restarting GERD medication on a regular basis and will treat for constipation.  Strict return precautions given.  Follow up with your PMD for recheck   I personally performed the services described in this documentation, which was scribed in my presence. The recorded information has been reviewed and is accurate.    Cy BlamerApril Nuchem Grattan, MD 05/15/14 [phone]

## 2014-05-14 NOTE — ED Notes (Signed)
RUQ abd pain x 2 days- sharp- +nausea

## 2014-05-15 ENCOUNTER — Emergency Department (HOSPITAL_BASED_OUTPATIENT_CLINIC_OR_DEPARTMENT_OTHER): Payer: Managed Care, Other (non HMO)

## 2014-05-15 ENCOUNTER — Encounter (HOSPITAL_BASED_OUTPATIENT_CLINIC_OR_DEPARTMENT_OTHER): Payer: Self-pay | Admitting: Emergency Medicine

## 2014-05-15 LAB — COMPREHENSIVE METABOLIC PANEL
ALT: 34 U/L (ref 0–53)
AST: 27 U/L (ref 0–37)
Albumin: 4.3 g/dL (ref 3.5–5.2)
Alkaline Phosphatase: 43 U/L (ref 39–117)
Anion gap: 8 (ref 5–15)
BILIRUBIN TOTAL: 0.1 mg/dL — AB (ref 0.3–1.2)
BUN: 14 mg/dL (ref 6–23)
CO2: 27 mmol/L (ref 19–32)
CREATININE: 0.79 mg/dL (ref 0.50–1.35)
Calcium: 8.7 mg/dL (ref 8.4–10.5)
Chloride: 104 mmol/L (ref 96–112)
GFR calc Af Amer: >90 mL/min (ref >90)
GFR calc non Af Amer: >90 mL/min (ref >90)
Glucose, Bld: 120 mg/dL — ABNORMAL HIGH (ref 70–99)
Potassium: 3.5 mmol/L (ref 3.5–5.1)
Sodium: 139 mmol/L (ref 135–145)
Total Protein: 7.1 g/dL (ref 6.0–8.3)

## 2014-05-15 LAB — CBC WITH DIFFERENTIAL/PLATELET
Basophils Absolute: 0.1 10*3/uL (ref 0.0–0.1)
Basophils Relative: 1 % (ref 0–1)
Eosinophils Absolute: 0.4 10*3/uL (ref 0.0–0.7)
Eosinophils Relative: 5 % (ref 0–5)
HCT: 45.8 % (ref 39.0–52.0)
HEMOGLOBIN: 15.3 g/dL (ref 13.0–17.0)
LYMPHS ABS: 2.9 10*3/uL (ref 0.7–4.0)
LYMPHS PCT: 32 % (ref 12–46)
MCH: 28.8 pg (ref 26.0–34.0)
MCHC: 33.4 g/dL (ref 30.0–36.0)
MCV: 86.3 fL (ref 78.0–100.0)
Monocytes Absolute: 0.8 10*3/uL (ref 0.1–1.0)
Monocytes Relative: 8 % (ref 3–12)
NEUTROS ABS: 5 10*3/uL (ref 1.7–7.7)
NEUTROS PCT: 54 % (ref 43–77)
PLATELETS: 221 10*3/uL (ref 150–400)
RBC: 5.31 MIL/uL (ref 4.22–5.81)
RDW: 12.7 % (ref 11.5–15.5)
WBC: 9.2 10*3/uL (ref 4.0–10.5)

## 2014-05-15 LAB — LIPASE, BLOOD: Lipase: 33 U/L (ref 11–59)

## 2014-05-15 MED ORDER — DICYCLOMINE HCL 10 MG/ML IM SOLN
20.0000 mg | Freq: Once | INTRAMUSCULAR | Status: AC
  Administered 2014-05-15: 20 mg via INTRAMUSCULAR
  Filled 2014-05-15: qty 2

## 2014-05-15 MED ORDER — POLYETHYLENE GLYCOL 3350 17 GM/SCOOP PO POWD: 17.0000 g | Freq: Every day | ORAL | Status: DC

## 2014-05-15 MED ORDER — GI COCKTAIL ~~LOC~~
30.0000 mL | Freq: Once | ORAL | Status: AC
  Administered 2014-05-15: 30 mL via ORAL
  Filled 2014-05-15: qty 30

## 2014-05-15 MED ORDER — OMEPRAZOLE 20 MG PO CPDR: 20.0000 mg | DELAYED_RELEASE_CAPSULE | Freq: Every day | ORAL | Status: DC

## 2016-04-11 IMAGING — CR DG HAND COMPLETE 3+V*R*
3 series · 3 of 3 positions shown · non-contrast
Comparison: None.

CLINICAL DATA: Trauma.  Hand injury.

EXAM:
RIGHT HAND - COMPLETE 3+ VIEW

[PA]
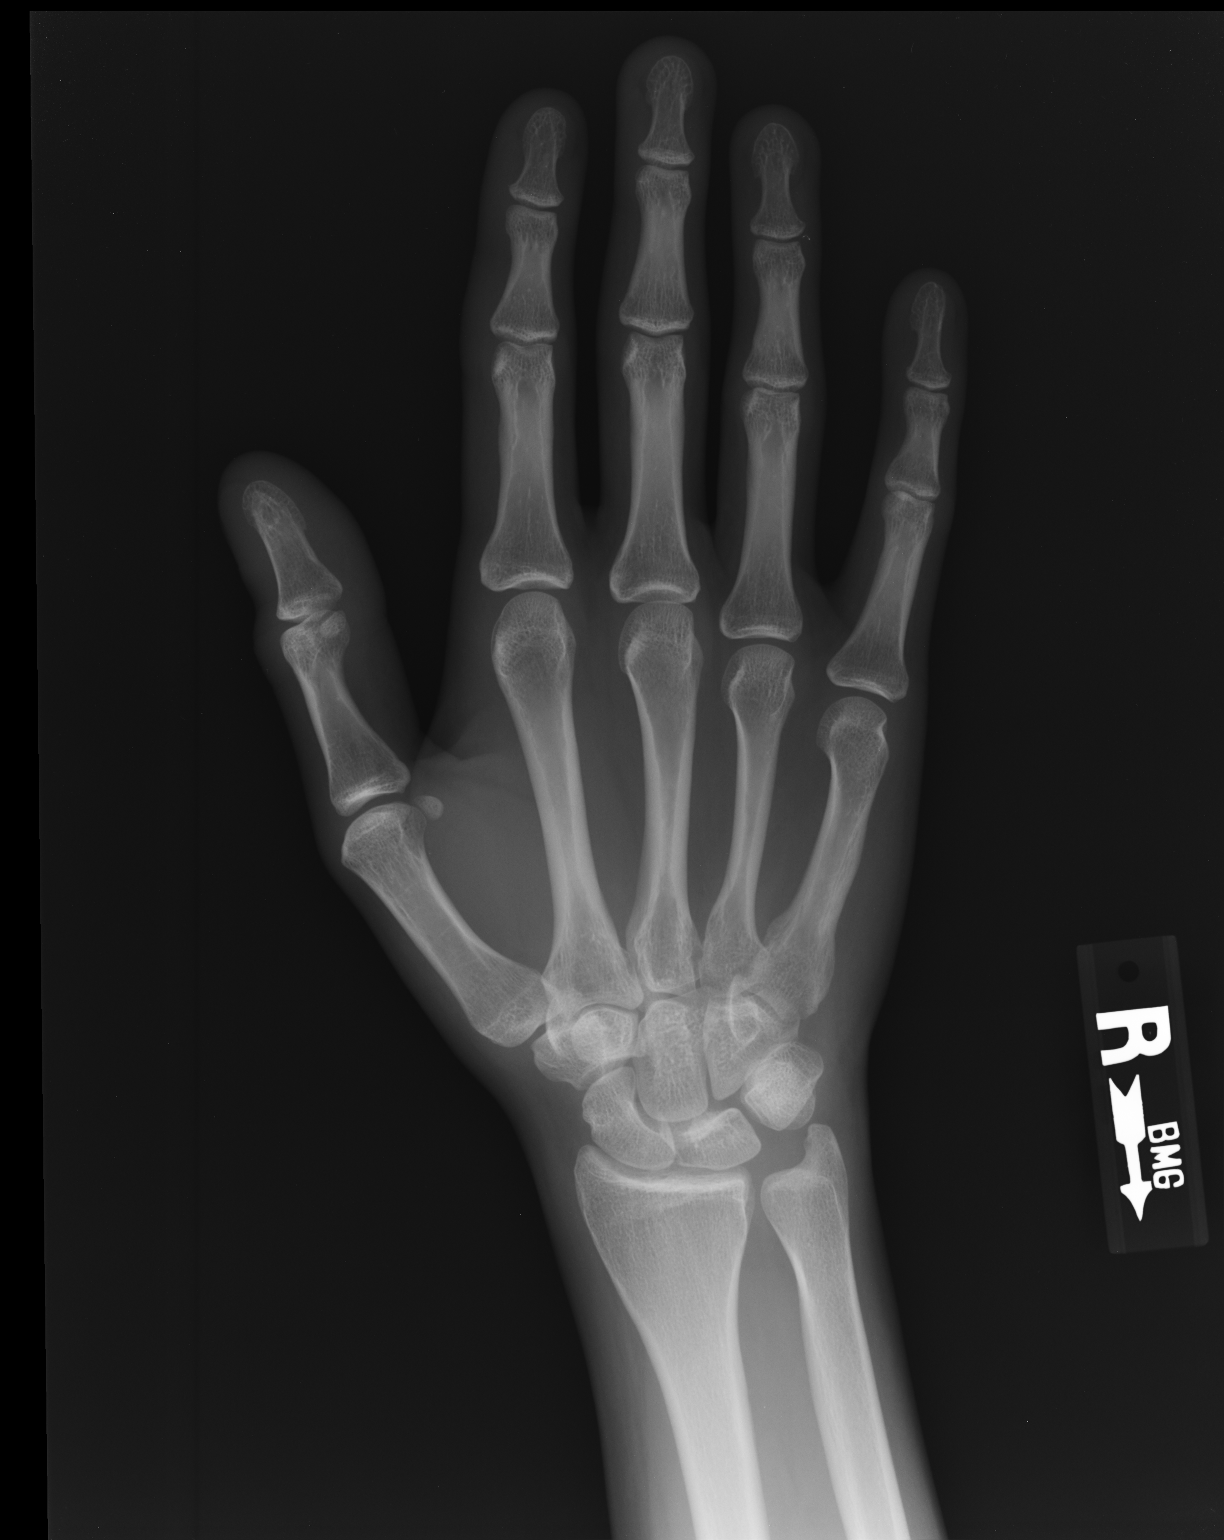

[pa obl]
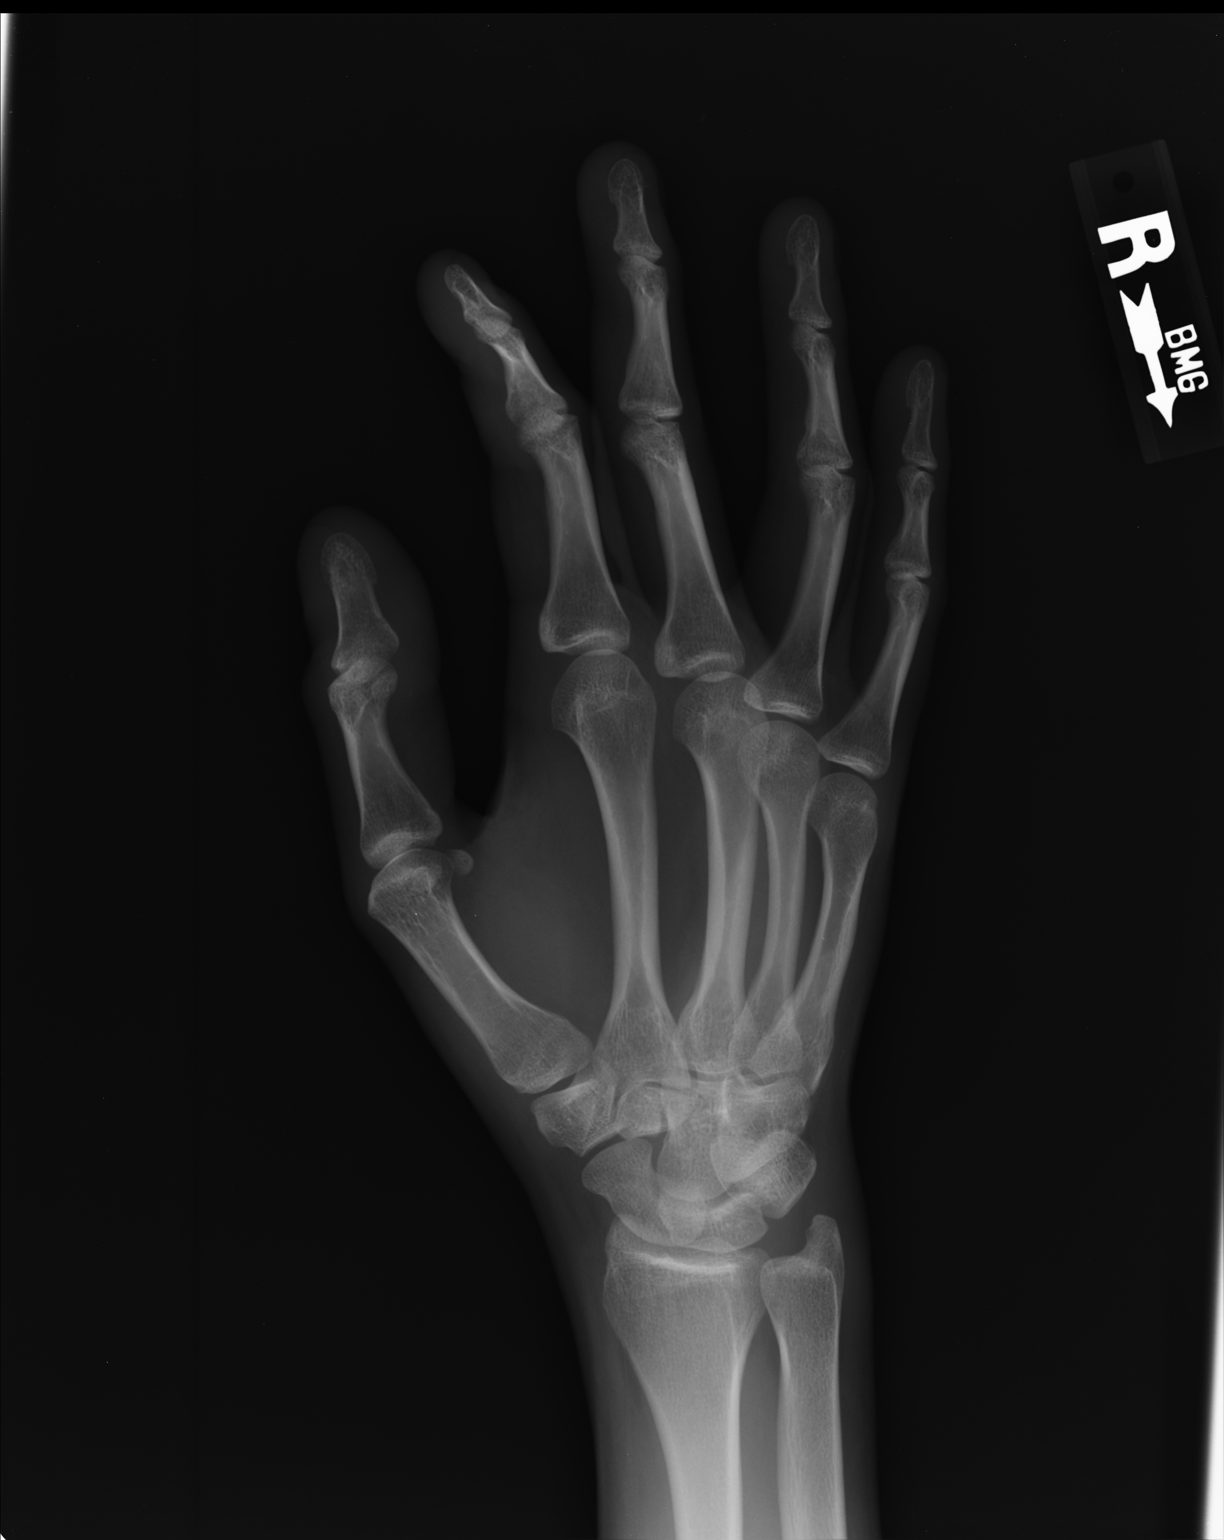

[lateral]
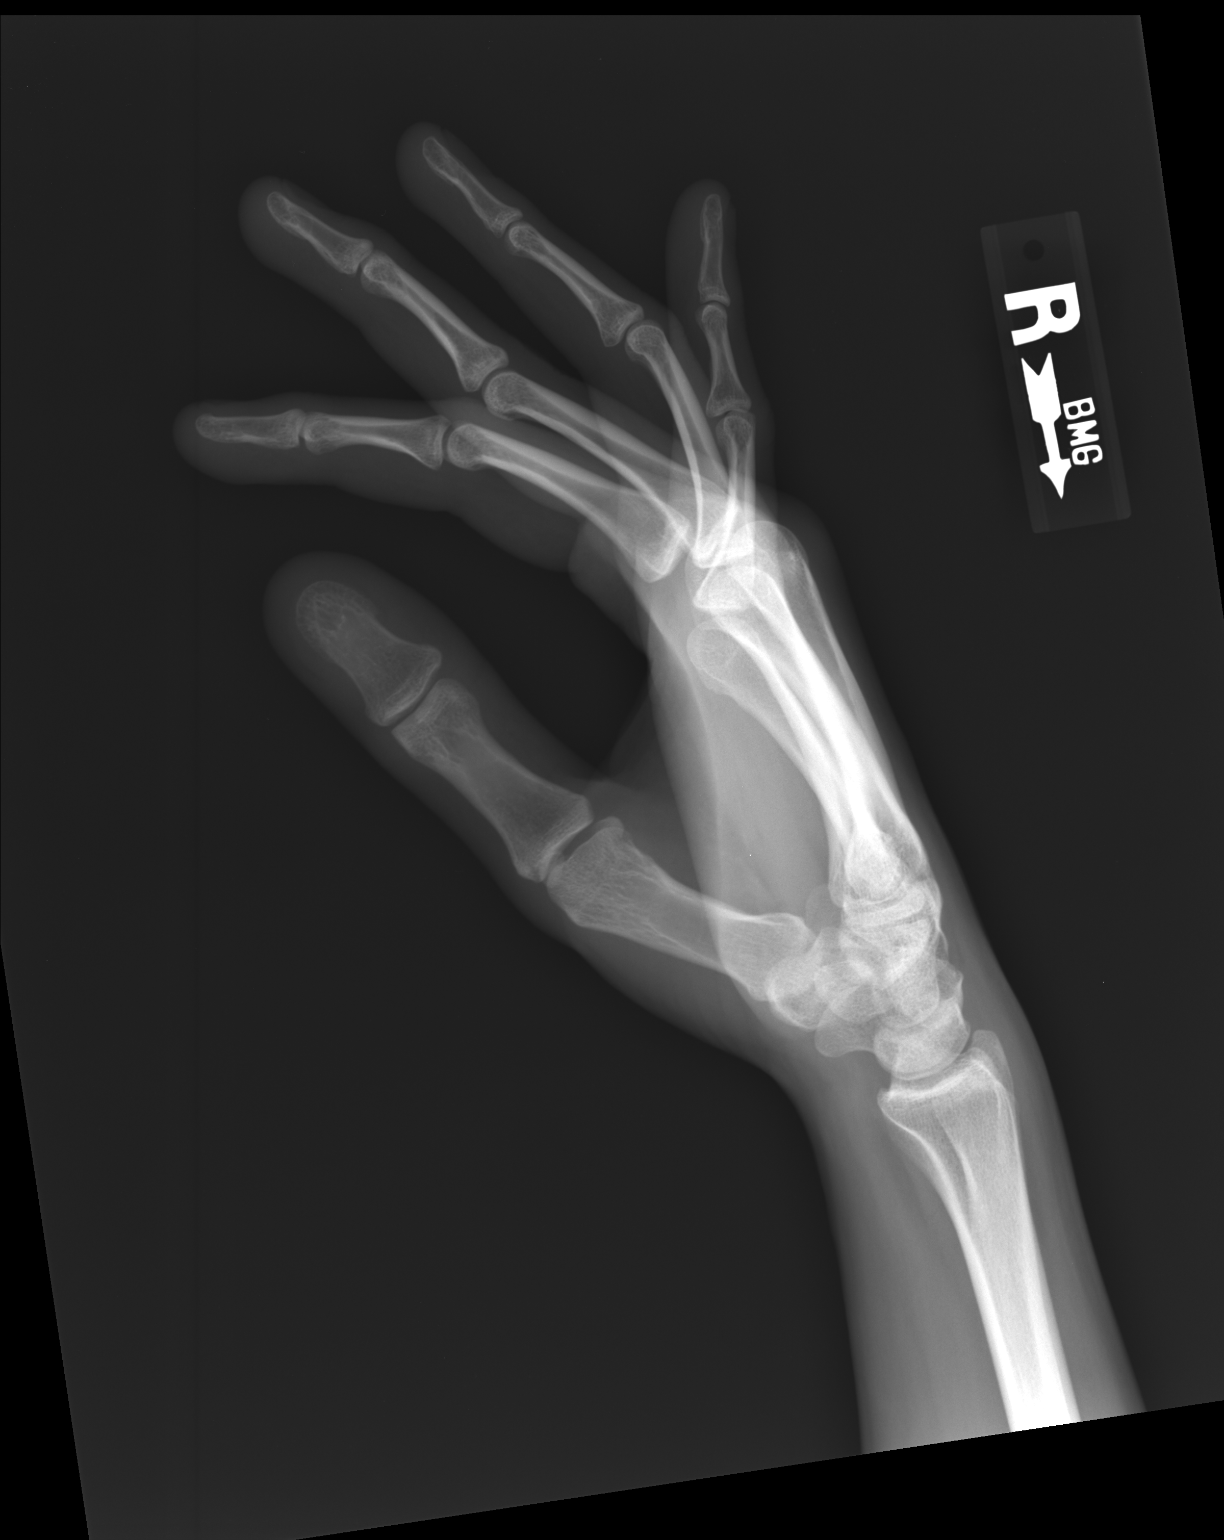

[3 of 3 positions shown; findings below may reference images not displayed]

FINDINGS: There is no evidence of fracture or dislocation. There is no
evidence of arthropathy or other focal bone abnormality. Soft
tissues are unremarkable.
IMPRESSION: Negative.

## 2016-09-19 DIAGNOSIS — R531 Weakness: Secondary | ICD-10-CM | POA: Diagnosis not present

## 2016-09-19 DIAGNOSIS — R404 Transient alteration of awareness: Secondary | ICD-10-CM | POA: Diagnosis not present

## 2016-09-23 ENCOUNTER — Encounter: Payer: Self-pay | Admitting: Physician Assistant

## 2016-09-23 ENCOUNTER — Ambulatory Visit (INDEPENDENT_AMBULATORY_CARE_PROVIDER_SITE_OTHER): Payer: BLUE CROSS/BLUE SHIELD | Admitting: Physician Assistant

## 2016-09-23 VITALS — BP 144/96 | HR 82 | Temp 98.3°F | Ht 74.0 in | Wt 222.4 lb

## 2016-09-23 DIAGNOSIS — R030 Elevated blood-pressure reading, without diagnosis of hypertension: Secondary | ICD-10-CM | POA: Diagnosis not present

## 2016-09-23 DIAGNOSIS — R400 Somnolence: Secondary | ICD-10-CM | POA: Diagnosis not present

## 2016-09-23 DIAGNOSIS — R0789 Other chest pain: Secondary | ICD-10-CM

## 2016-09-23 LAB — COMPREHENSIVE METABOLIC PANEL
ALK PHOS: 56 U/L (ref 39–117)
ALT: 46 U/L (ref 0–53)
AST: 27 U/L (ref 0–37)
Albumin: 4.1 g/dL (ref 3.5–5.2)
BUN: 14 mg/dL (ref 6–23)
CO2: 32 mEq/L (ref 19–32)
CREATININE: 0.88 mg/dL (ref 0.40–1.50)
Calcium: 9.2 mg/dL (ref 8.4–10.5)
Chloride: 102 mEq/L (ref 96–112)
GFR: 109.54 mL/min (ref >60.00)
GLUCOSE: 96 mg/dL (ref 70–99)
Potassium: 4.2 mEq/L (ref 3.5–5.1)
Sodium: 138 mEq/L (ref 135–145)
TOTAL PROTEIN: 7.3 g/dL (ref 6.0–8.3)
Total Bilirubin: 0.4 mg/dL (ref 0.2–1.2)

## 2016-09-23 LAB — CBC WITH DIFFERENTIAL/PLATELET
BASOS ABS: 0 10*3/uL (ref 0.0–0.1)
Basophils Relative: 0.5 % (ref 0.0–3.0)
EOS ABS: 0.3 10*3/uL (ref 0.0–0.7)
Eosinophils Relative: 3.5 % (ref 0.0–5.0)
HCT: 47.6 % (ref 39.0–52.0)
Hemoglobin: 16 g/dL (ref 13.0–17.0)
Lymphocytes Relative: 31.4 % (ref 12.0–46.0)
Lymphs Abs: 2.6 10*3/uL (ref 0.7–4.0)
MCHC: 33.7 g/dL (ref 30.0–36.0)
MCV: 88.7 fl (ref 78.0–100.0)
Monocytes Absolute: 0.6 10*3/uL (ref 0.1–1.0)
Monocytes Relative: 7.2 % (ref 3.0–12.0)
Neutro Abs: 4.8 10*3/uL (ref 1.4–7.7)
Neutrophils Relative %: 57.4 % (ref 43.0–77.0)
Platelets: 220 10*3/uL (ref 150.0–400.0)
RBC: 5.36 Mil/uL (ref 4.22–5.81)
RDW: 13.4 % (ref 11.5–15.5)
WBC: 8.4 10*3/uL (ref 4.0–10.5)

## 2016-09-23 LAB — TSH: TSH: 1.36 u[IU]/mL (ref 0.35–4.50)

## 2016-09-23 NOTE — Patient Instructions (Addendum)
It was great to meet you, welcome to [place]!   We will call you with your lab results.  Please let us know if you develop any new symptoms such as changes in chest discomfort, shortness of breath, or dizziness.  Let's work on getting your caffeine down to 2 servings daily and your alcohol intake down to 1 drink daily most days of the week.  Keep a blood pressure log. Follow-up with [place] in 2 weeks to re-check your blood pressure. If in the meantime, you have blood pressure readings with the top number consistently > 150 or bottom number > 100, please give [place] a call.  You will be contacted about your sleep study.

## 2016-09-23 NOTE — Progress Notes (Signed)
Jonathan Spencer is a 26 y.o. male here for a new problem.   History of Present Illness:   Chief Complaint  Patient presents with  . Establish Care    HPI   At the beginning of last week, he started feeling "weird." Had twitching that started in his L leg then transferred to both legs. Later that week, had some lightheadedness, twitchy in arms/legs  --> he was at work and they called EMS. EMS did EKG -- said it was normal. BP was 150/125. Blood sugar was normal. They told him that he was fine and that he could go to the ED if wanted, but he didn't. Bought a blood pressure cuff over the weekend after this event. Readings have been 125-145 systolic, <100 diastolic. Lightheaded over the weekend with walking. Endorses a history of anxiety. Does not feel like he drinks much water, drinks seltzer water. Drinks caffeinated diet sodas -- 4-5 cans daily. No coffee, no energy drinks.  Eats all food groups. Sometimes skips breakfast. Less often eating burgers and fast food, tries to eat more salads.  Limited exercise right now -- no scheduled exercise. Drinks liquor, about 2-3 per night after working, totaling 10-15 per week. Reports that he has occasional chest discomfort -- but this is likely his GERD and normal for him.  Occasional blurred vision. Some chest discomfort. No swelling in legs. Unusual headaches -- over the weekend, had a HA (7/10) -- went away without treatment. Denies n/v/d or any further numbness/tingling sensation.   Fiance has noticed over the past couple years that his snoring has gotten worse and that he "stops breathing at night for 30 seconds at a time." Does feel like he has daytime somnolence. Has never had a sleep study, dad has sleep apnea and is noncompliant with CPAP.   Past Medical History:  Diagnosis Date  . Abdominal pain, unspecified site   . Acid reflux   . Chest pain   . Palpitation      Social History   Social History  . Marital status: Single    Spouse  name: N/A  . Number of children: N/A  . Years of education: N/A   Occupational History  . car Water quality scientist, Forensic psychologist    Social History Main Topics  . Smoking status: Former Games developer  . Smokeless tobacco: Current User    Types: Snuff     Comment: very little, less than a pack per week, socially  . Alcohol use 0.0 oz/week     Comment: 10-15 per week  . Drug use: No  . Sexual activity: Yes   Other Topics Concern  . Not on file   Social History Narrative   Engaged, lives with fiance   Work in Research scientist (life sciences) sales   Baby due Nov 18    No past surgical history on file.  Family History  Problem Relation Age of Onset  . Heart disease Mother 80       scad  . Heart disease Maternal Grandmother   . Hearing loss Maternal Grandmother   . Cancer Maternal Grandfather        lung  . Alcohol abuse Paternal Grandmother   . COPD Paternal Grandmother   . Alcohol abuse Paternal Grandfather   . Cancer Paternal Grandfather        prostate and bone  . Drug abuse Paternal Grandfather     No Known Allergies  Current Medications:   Current Outpatient Prescriptions:  .  omeprazole (PRILOSEC OTC) 20 MG tablet, Take 20 mg by mouth daily., Disp: , Rfl:    Review of Systems:   Review of Systems  Constitutional: Negative for chills, fever, malaise/fatigue and weight loss.  Respiratory: Negative for sputum production and shortness of breath.   Cardiovascular: Positive for chest pain. Negative for palpitations, orthopnea, claudication and leg swelling.  Musculoskeletal: Negative for myalgias.  Neurological: Positive for headaches. Negative for dizziness, tingling, tremors, sensory change, speech change and focal weakness.  Psychiatric/Behavioral: Negative for depression. The patient is nervous/anxious.      Vitals:   Vitals:   09/23/16 0934 09/23/16 1025  BP: (!) 150/90 (!) 144/96  Pulse: 82   Temp: 98.3 F (36.8 C)   TempSrc: Oral   SpO2: 97%   Weight: 222 lb 6.4 oz  (100.9 kg)   Height: 6\' 2"  (1.88 m)      Body mass index is 28.55 kg/m.  Physical Exam:   Physical Exam  Constitutional: He appears well-developed. He is cooperative.  Non-toxic appearance. He does not have a sickly appearance. He does not appear ill. No distress.  HENT:  Mouth/Throat: Uvula is midline, oropharynx is clear and moist and mucous membranes are normal. Tonsils are 1+ on the right. Tonsils are 1+ on the left. No tonsillar exudate.  Cardiovascular: Normal rate, regular rhythm, S1 normal, S2 normal, normal heart sounds and normal pulses.   No LE edema  Pulmonary/Chest: Effort normal and breath sounds normal.  Neurological: He is alert. He has normal strength. No cranial nerve deficit or sensory deficit. Coordination and gait normal. GCS eye subscore is 4. GCS verbal subscore is 5. GCS motor subscore is 6.  Reflex Scores:      Patellar reflexes are 2+ on the right side and 2+ on the left side. Skin: Skin is warm, dry and intact.  Psychiatric: He has a normal mood and affect. His speech is normal and behavior is normal.  Nursing note and vitals reviewed.  EKG tracing is personally reviewed.  EKG notes NSR.  No acute changes.   Assessment and Plan:    Josten was seen today for establish care.  Diagnoses and all orders for this visit:  Elevated blood pressure reading Blood pressure reading today elevated. Continue to monitor BP at home with monitor and keep record. Follow-up in 2 weeks to re-check blood pressure. Advised patient as follows: If in the meantime, you have blood pressure readings with the top number consistently > 150 or bottom number > 100, please give [place] a call. Advised patient to reduce alcohol intake and caffeine intake. -     EKG 12-Lead -     CBC with Differential/Platelet -     Comprehensive metabolic panel -     TSH  Chest discomfort Likely GERD and possibly anxiety related. No exertional symptoms or red flags on exam. EKG tracing is personally  reviewed.  EKG notes NSR.  No acute changes. Advised patient that he should let us know if he develops any new symptoms such as changes in chest discomfort, shortness of breath, or dizziness. -     EKG 12-Lead -     CBC with Differential/Platelet -     Comprehensive metabolic panel -     TSH  Daytime somnolence Concerns for sleep apnea, based on history will order home sleep study. -     Home sleep test; Future    . Reviewed expectations re: course of current medical issues. . Discussed self-management of symptoms. Marland Kitchen  Outlined signs and symptoms indicating need for more acute intervention. . Patient verbalized understanding and all questions were answered. . See orders for this visit as documented in the electronic medical record. . Patient received an After-Visit Summary.  CMA or LPN served as scribe during this visit. History, Physical, and Plan performed by medical provider. Documentation and orders reviewed and attested to.  Inda Coke, PA-C

## 2016-10-07 ENCOUNTER — Ambulatory Visit (INDEPENDENT_AMBULATORY_CARE_PROVIDER_SITE_OTHER): Payer: BLUE CROSS/BLUE SHIELD | Admitting: Physician Assistant

## 2016-10-07 ENCOUNTER — Encounter: Payer: Self-pay | Admitting: Physician Assistant

## 2016-10-07 VITALS — BP 124/88 | HR 67 | Temp 97.9°F | Ht 74.0 in | Wt 221.0 lb

## 2016-10-07 DIAGNOSIS — R030 Elevated blood-pressure reading, without diagnosis of hypertension: Secondary | ICD-10-CM

## 2016-10-07 DIAGNOSIS — R400 Somnolence: Secondary | ICD-10-CM | POA: Diagnosis not present

## 2016-10-07 NOTE — Progress Notes (Signed)
Jonathan Spencer is a 26 y.o. male is here for follow up on blood pressure.  I acted as a Neurosurgeon for [employer], PA-C Corky Mull, LPN  History of Present Illness:   Chief Complaint  Patient presents with  . Follow-up  . Hypertension    Elevated Blood Pressure This is a new problem. Episode onset: Pt here for 2 week follow up on blood pressure. Pt has been checking blood pressure at home, systolic is 120-140, diastolic ranging 45-40. If checks in morning it is in the 120's, evening 140's. The problem has been gradually improving since onset. The problem is uncontrolled. Associated symptoms include blurred vision. Pertinent negatives include no headaches, palpitations, peripheral edema or shortness of breath. (Acid reflux) Risk factors for coronary artery disease include male gender. Improvement on treatment: Pt currently not on medication.    He reports that his fiance has noticed that he has pauses in his breathing and has had significant snoring. Also complains of daytime somnolence.    GAD 7 : Generalized Anxiety Score 10/07/2016  Nervous, Anxious, on Edge 3  Control/stop worrying 1  Worry too much - different things 2  Trouble relaxing 3  Restless 3  Easily annoyed or irritable 2  Afraid - awful might happen 0  Total GAD 7 Score 14      There are no preventive care reminders to display for this patient.  Past Medical History:  Diagnosis Date  . Abdominal pain, unspecified site   . Acid reflux   . Chest pain   . Palpitation      Social History   Social History  . Marital status: Single    Spouse name: N/A  . Number of children: N/A  . Years of education: N/A   Occupational History  . car Water quality scientist, Forensic psychologist    Social History Main Topics  . Smoking status: Former Games developer  . Smokeless tobacco: Current User    Types: Snuff     Comment: very little, less than a pack per week, socially  . Alcohol use 0.0 oz/week   Comment: 10-15 per week  . Drug use: No  . Sexual activity: Yes   Other Topics Concern  . Not on file   Social History Narrative   Engaged, lives with fiance   Work in Research scientist (life sciences) sales   Baby due Nov 18    No past surgical history on file.  Family History  Problem Relation Age of Onset  . Heart disease Mother 20       scad  . Heart disease Maternal Grandmother   . Hearing loss Maternal Grandmother   . Cancer Maternal Grandfather        lung  . Alcohol abuse Paternal Grandmother   . COPD Paternal Grandmother   . Alcohol abuse Paternal Grandfather   . Cancer Paternal Grandfather        prostate and bone  . Drug abuse Paternal Grandfather     PMHx, SurgHx, SocialHx, FamHx, Medications, and Allergies were reviewed in the Visit Navigator and updated as appropriate.   Patient Active Problem List   Diagnosis Date Noted  . Chest pain   . Abdominal pain, unspecified site   . Palpitation     Social History  Substance Use Topics  . Smoking status: Former Games developer  . Smokeless tobacco: Current User    Types: Snuff     Comment: very little, less than a pack per week, socially  .  Alcohol use 0.0 oz/week     Comment: 10-15 per week    Current Medications and Allergies:    Current Outpatient Prescriptions:  Marland Kitchen.  Multiple Vitamins-Minerals (MULTI-VITAMIN GUMMIES PO), Take 2 each by mouth daily., Disp: , Rfl:  .  omeprazole (PRILOSEC OTC) 20 MG tablet, Take 20 mg by mouth daily., Disp: , Rfl:   No Known Allergies  Review of Systems   Review of Systems  Constitutional: Negative for chills and fever.  HENT: Negative for hearing loss.   Eyes: Negative for blurred vision.  Respiratory: Negative for shortness of breath.   Cardiovascular: Negative for palpitations.  Neurological: Positive for headaches.  Psychiatric/Behavioral: Negative for depression. The patient is not nervous/anxious.     Vitals:   Vitals:   10/07/16 0913 10/07/16 0941  BP: 128/90 124/88  Pulse: 67 67    Temp: 97.9 F (36.6 C)   TempSrc: Oral   SpO2: 95%   Weight: 221 lb (100.2 kg)   Height: 6\' 2"  (1.88 m)      Body mass index is 28.37 kg/m.   Physical Exam:   Physical Exam  Constitutional: He appears well-developed. He is cooperative.  Non-toxic appearance. He does not have a sickly appearance. He does not appear ill. No distress.  Cardiovascular: Normal rate, regular rhythm, S1 normal, S2 normal, normal heart sounds and normal pulses.   No LE edema  Pulmonary/Chest: Effort normal and breath sounds normal.  Neurological: He is alert. GCS eye subscore is 4. GCS verbal subscore is 5. GCS motor subscore is 6.  Skin: Skin is warm, dry and intact.  Psychiatric: He has a normal mood and affect. His speech is normal and behavior is normal.  Nursing note and vitals reviewed.    Assessment and Plan:    Jonathan Spencer was seen today for follow-up and hypertension.  Diagnoses and all orders for this visit:  Elevated blood pressure reading Systolic blood pressures are overall well controlled, based on report of home readings, however diastolic sometimes remains above 90. I think that this is secondary to possible sleep apnea and when discussing this with patient, he would like to wait until he gets his sleep study results prior to starting medication. I discussed with him to continue to monitor home BP and if in the meantime he develops any consistent readings >140/100 to let us know.   Daytime somnolence Home sleep test was ordered at the last visit and is still pending. I have discussed this with our referral coordinator.  Also -- discussed need for flu vaccine and updated Tdap as his fiancee is currently in her 3rd trimester and is due in November --> I have recommended that he make a nurse visit to get updated vaccines at his convenience, he reports that he will discuss this with his fiancee.   Follow-up in 6 months for a physical.   . Reviewed expectations re: course of current medical  issues. . Discussed self-management of symptoms. . Outlined signs and symptoms indicating need for more acute intervention. . Patient verbalized understanding and all questions were answered. . See orders for this visit as documented in the electronic medical record. . Patient received an After Visit Summary.  CMA or LPN served as scribe during this visit. History, Physical, and Plan performed by medical provider. Documentation and orders reviewed and attested to.  Jarold MottoSamantha Kaysey Berndt, PA-C [place] 10/07/2016  Follow-up: No Follow-up on file.

## 2016-10-07 NOTE — Patient Instructions (Addendum)
It was great to see you!  Please consider returning in October for a nurse visit to have a flu shot and a Tdap (this is to help prevent pertussis for your new little one!)  If you haven't heard anything in the next 2 weeks for your sleep study, please call our office.  Please continue to check your blood pressure at home, if your top number is consistently higher than 140 or the bottom number higher than 100, let us know.

## 2017-05-11 ENCOUNTER — Ambulatory Visit: Payer: Self-pay | Admitting: *Deleted

## 2017-05-11 ENCOUNTER — Encounter: Payer: Self-pay | Admitting: Physician Assistant

## 2017-05-11 ENCOUNTER — Ambulatory Visit: Payer: BLUE CROSS/BLUE SHIELD | Admitting: Physician Assistant

## 2017-05-11 VITALS — BP 142/80 | HR 88 | Temp 98.6°F | Ht 74.0 in | Wt 239.5 lb

## 2017-05-11 DIAGNOSIS — R0683 Snoring: Secondary | ICD-10-CM

## 2017-05-11 DIAGNOSIS — R002 Palpitations: Secondary | ICD-10-CM

## 2017-05-11 DIAGNOSIS — R400 Somnolence: Secondary | ICD-10-CM

## 2017-05-11 LAB — CBC WITH DIFFERENTIAL/PLATELET
BASOS ABS: 0.1 10*3/uL (ref 0.0–0.1)
Basophils Relative: 0.7 % (ref 0.0–3.0)
Eosinophils Absolute: 0.3 10*3/uL (ref 0.0–0.7)
Eosinophils Relative: 4 % (ref 0.0–5.0)
HCT: 45.5 % (ref 39.0–52.0)
HEMOGLOBIN: 15.8 g/dL (ref 13.0–17.0)
Lymphocytes Relative: 31 % (ref 12.0–46.0)
Lymphs Abs: 2.5 10*3/uL (ref 0.7–4.0)
MCHC: 34.7 g/dL (ref 30.0–36.0)
MCV: 86.6 fl (ref 78.0–100.0)
Monocytes Absolute: 0.7 10*3/uL (ref 0.1–1.0)
Monocytes Relative: 9.1 % (ref 3.0–12.0)
Neutro Abs: 4.4 10*3/uL (ref 1.4–7.7)
Neutrophils Relative %: 55.2 % (ref 43.0–77.0)
Platelets: 216 10*3/uL (ref 150.0–400.0)
RBC: 5.25 Mil/uL (ref 4.22–5.81)
RDW: 13.5 % (ref 11.5–15.5)
WBC: 8 10*3/uL (ref 4.0–10.5)

## 2017-05-11 LAB — COMPREHENSIVE METABOLIC PANEL
ALBUMIN: 4.3 g/dL (ref 3.5–5.2)
ALK PHOS: 54 U/L (ref 39–117)
ALT: 59 U/L — ABNORMAL HIGH (ref 0–53)
AST: 33 U/L (ref 0–37)
BUN: 9 mg/dL (ref 6–23)
CO2: 30 mEq/L (ref 19–32)
Calcium: 9 mg/dL (ref 8.4–10.5)
Chloride: 103 mEq/L (ref 96–112)
Creatinine, Ser: 0.84 mg/dL (ref 0.40–1.50)
GFR: 115.06 mL/min (ref >60.00)
Glucose, Bld: 79 mg/dL (ref 70–99)
Potassium: 3.7 mEq/L (ref 3.5–5.1)
SODIUM: 139 meq/L (ref 135–145)
TOTAL PROTEIN: 7.3 g/dL (ref 6.0–8.3)
Total Bilirubin: 0.3 mg/dL (ref 0.2–1.2)

## 2017-05-11 LAB — TSH: TSH: 1.12 u[IU]/mL (ref 0.35–4.50)

## 2017-05-11 MED ORDER — PROPRANOLOL HCL 10 MG PO TABS: 10.0000 mg | ORAL_TABLET | Freq: Three times a day (TID) | ORAL | 1 refills | Status: DC | PRN

## 2017-05-11 NOTE — Progress Notes (Signed)
Jonathan Spencer is a 27 y.o. male here for a recurrent problem.  I acted as a Neurosurgeon for [employer], PA-C Jonathan Mull, LPN  History of Present Illness:   Chief Complaint  Patient presents with  . Palpitations    Palpitations   This is a recurrent problem. Episode onset: Started on Saturday. The problem occurs constantly. The problem has been waxing and waning. On average, each episode lasts 2 seconds. Nothing aggravates the symptoms. Associated symptoms include an irregular heartbeat and shortness of breath (during the episodes). Pertinent negatives include no anxiety, chest fullness, chest pain, coughing, diaphoresis, dizziness, fever, malaise/fatigue, nausea, near-syncope, numbness, syncope, vomiting or weakness. He has tried deep relaxation for the symptoms. The treatment provided no relief. Risk factors include being male.   Stopped drinking on Wednesday of last week, today is day 5. He was having 2-3 mixed drinks x  5-6 days a week. He denies tremors or unusual HA. Denies significant stress in his life, does have a 35 months old. He has a BP cuff at home but has not been checking it regularly. Has tried to cut down on his caffeine intake. Symptoms do not worsen with activity.  Did see cardiologist in 2015 when he was having some issues with chest discomfort, was told that his symptoms were not of cardiac origin, did not have scheduled follow-up.  He continues to have issues with snoring and daytime somnolence. He was never contacted for a home sleep study in Aug 2018.  He has had almost 20 lb weight gain in 7 months.  Wt Readings from Last 5 Encounters:  05/11/17 239 lb 8 oz (108.6 kg)  10/07/16 221 lb (100.2 kg)  09/23/16 222 lb 6.4 oz (100.9 kg)  05/14/14 185 lb (83.9 kg)  05/11/14 194 lb (88 kg)     Past Medical History:  Diagnosis Date  . Abdominal pain, unspecified site   . Acid reflux   . Chest pain   . Palpitation      Social History   Socioeconomic  History  . Marital status: Single    Spouse name: Not on file  . Number of children: Not on file  . Years of education: Not on file  . Highest education level: Not on file  Occupational History  . Occupation: Community education officer    Comment: Airline pilot, Forensic psychologist   Social Needs  . Financial resource strain: Not on file  . Food insecurity:    Worry: Not on file    Inability: Not on file  . Transportation needs:    Medical: Not on file    Non-medical: Not on file  Tobacco Use  . Smoking status: Former Games developer  . Smokeless tobacco: Current User    Types: Snuff  . Tobacco comment: very little, less than a pack per week, socially  Substance and Sexual Activity  . Alcohol use: Yes    Alcohol/week: 0.0 oz    Comment: 10-15 per week  . Drug use: No  . Sexual activity: Yes  Lifestyle  . Physical activity:    Days per week: Not on file    Minutes per session: Not on file  . Stress: Not on file  Relationships  . Social connections:    Talks on phone: Not on file    Gets together: Not on file    Attends religious service: Not on file    Active member of club or organization: Not on file    Attends meetings of clubs  or organizations: Not on file    Relationship status: Not on file  . Intimate partner violence:    Fear of current or ex partner: Not on file    Emotionally abused: Not on file    Physically abused: Not on file    Forced sexual activity: Not on file  Other Topics Concern  . Not on file  Social History Narrative   Engaged, lives with fiance   Work in Research scientist (life sciences) sales   70 month old girl, Jonathan Spencer    History reviewed. No pertinent surgical history.  Family History  Problem Relation Age of Onset  . Heart disease Mother 70       scad  . Heart disease Maternal Grandmother   . Hearing loss Maternal Grandmother   . Cancer Maternal Grandfather        lung  . Alcohol abuse Paternal Grandmother   . COPD Paternal Grandmother   . Alcohol abuse Paternal Grandfather   . Cancer  Paternal Grandfather        prostate and bone  . Drug abuse Paternal Grandfather     No Known Allergies  Current Medications:   Current Outpatient Medications:  Marland Kitchen  Multiple Vitamins-Minerals (MULTI-VITAMIN GUMMIES PO), Take 2 each by mouth daily., Disp: , Rfl:  .  omeprazole (PRILOSEC OTC) 20 MG tablet, Take 20 mg by mouth daily., Disp: , Rfl:  .  propranolol (INDERAL) 10 MG tablet, Take 1 tablet (10 mg total) by mouth 3 (three) times daily as needed., Disp: 30 tablet, Rfl: 1   Review of Systems:   Review of Systems  Constitutional: Negative for diaphoresis, fever and malaise/fatigue.  Respiratory: Positive for shortness of breath (during the episodes). Negative for cough.   Cardiovascular: Positive for palpitations. Negative for chest pain, syncope and near-syncope.  Gastrointestinal: Negative for nausea and vomiting.  Neurological: Negative for dizziness, weakness and numbness.  Psychiatric/Behavioral: The patient is not nervous/anxious.     Vitals:   Vitals:   05/11/17 1341  BP: (!) 142/80  Pulse: 88  Temp: 98.6 F (37 C)  TempSrc: Oral  SpO2: 96%  Weight: 239 lb 8 oz (108.6 kg)  Height: 6\' 2"  (1.88 m)     Body mass index is 30.75 kg/m.  Physical Exam:   Physical Exam  Constitutional: He appears well-developed. He is cooperative.  Non-toxic appearance. He does not have a sickly appearance. He does not appear ill. No distress.  Cardiovascular: Normal rate, regular rhythm, S1 normal, S2 normal, normal heart sounds and normal pulses.  No LE edema  Pulmonary/Chest: Effort normal and breath sounds normal.  Neurological: He is alert. GCS eye subscore is 4. GCS verbal subscore is 5. GCS motor subscore is 6.  Skin: Skin is warm, dry and intact.  Psychiatric: He has a normal mood and affect. His speech is normal and behavior is normal.  Nursing note and vitals reviewed.  EKG tracing is personally reviewed.  EKG notes NSR.  No acute changes.   Assessment and Plan:     Jonathan Spencer was seen today for palpitations.  Diagnoses and all orders for this visit:  Palpitations EKG tracing is personally reviewed.  EKG notes NSR.  No acute changes. I believe patient would benefit from Holter monitor, given frequency of symptoms. He is agreeable to cardiology consult for this. I have briefly discussed with Dr. Helane Rima. I have given him a short prescription of 10 mg propranolol to use TID prn for his symptoms if he would like. Continue to  avoid caffeine. I also recommended that if he develops significantly worsening symptoms he needs to go to the ER. -     EKG 12-Lead -     TSH -     Comprehensive metabolic panel -     CBC with Differential/Platelet -     Ambulatory referral to Cardiology  Daytime somnolence and Snoring History is highly suspicious for sleep apnea. I have ordered a split night test for him to complete. I encouraged him to notify our office if he has not heard anything from them in 1-2 weeks for scheduling. -     Split night study; Future  Other orders -     propranolol (INDERAL) 10 MG tablet; Take 1 tablet (10 mg total) by mouth 3 (three) times daily as needed.    . Reviewed expectations re: course of current medical issues. . Discussed self-management of symptoms. . Outlined signs and symptoms indicating need for more acute intervention. . Patient verbalized understanding and all questions were answered. . See orders for this visit as documented in the electronic medical record. . Patient received an After-Visit Summary.  CMA or LPN served as scribe during this visit. History, Physical, and Plan performed by medical provider. Documentation and orders reviewed and attested to.  Jarold MottoSamantha Ambry Dix, PA-C

## 2017-05-11 NOTE — Telephone Encounter (Signed)
Noted  

## 2017-05-11 NOTE — Telephone Encounter (Signed)
Pt called with complaints of "feels like a sudden thump in chest and a lump in throat"; happens dozens of times a day; sometimes takes his breath"; recommendations made per nurse triage protocol to include seeing a physician within 4 hours; pt offered and accepted an appointment with Jarold MottoSamantha Worley, [facility] today at 1340; he verbalizes understanding; will route to office for notification of this upcoming appointment.  Reason for Disposition . [1] Skipped or extra beat(s) AND [2] occurs 4 or more times per minute  Answer Assessment - Initial Assessment Questions 1. DESCRIPTION: "Please describe your heart rate or heart beat that you are having" (e.g., fast/slow, regular/irregular, skipped or extra beats, "palpitations")     palpitations 2. ONSET: "When did it start?" (Minutes, hours or days)      05/08/17 3. DURATION: "How long does it last" (e.g., seconds, minutes, hours)     seconds 4. PATTERN "Does it come and go, or has it been constant since it started?"  "Does it get worse with exertion?"   "Are you feeling it now?"     Intermittent; not awssociated with  5. TAP: "Using your hand, can you tap out what you are feeling on a chair or table in front of you, so that I can hear?" (Note: not all patients can do this)       n/a 6. HEART RATE: "Can you tell me your heart rate?" "How many beats in 15 seconds?"  (Note: not all patients can do this)     20 beats in 15 seconds 7. RECURRENT SYMPTOM: "Have you ever had this before?" If so, ask: "When was the last time?" and "What happened that time?"      no 8. CAUSE: "What do you think is causing the palpitations?"     unsure 9. CARDIAC HISTORY: "Do you have any history of heart disease?" (e.g., heart attack, angina, bypass surgery, angioplasty, arrhythmia)      hypertension 10. OTHER SYMPTOMS: "Do you have any other symptoms?" (e.g., dizziness, chest pain, sweating, difficulty breathing)       no 11. PREGNANCY: "Is there any chance you  are pregnant?" "When was your last menstrual period?"       no  Protocols used: HEART RATE AND HEARTBEAT QUESTIONS-A-AH

## 2017-05-11 NOTE — Patient Instructions (Addendum)
It was great to see you!  We will contact you with your lab results.  Please contact me if you do not hear about your sleep study or from the cardiologist in 1-2 weeks.  If you develop any severe pain or shortness of breath, please go to the ER!  You may take the 10 mg propranolol up to 3 times daily as needed for your palpitations.

## 2017-05-12 NOTE — Progress Notes (Signed)
Please call patient and let him know that all of the labwork that we completed came back normal, including kidney, liver, blood sugar, infection count, and thyroid levels.

## 2017-05-19 ENCOUNTER — Other Ambulatory Visit: Payer: Self-pay | Admitting: Physician Assistant

## 2017-05-19 DIAGNOSIS — R400 Somnolence: Secondary | ICD-10-CM

## 2017-05-19 DIAGNOSIS — R030 Elevated blood-pressure reading, without diagnosis of hypertension: Secondary | ICD-10-CM

## 2017-06-01 ENCOUNTER — Telehealth: Payer: Self-pay | Admitting: Physician Assistant

## 2017-06-01 NOTE — Telephone Encounter (Signed)
Copied from CRM [id number]. Topic: Quick Communication - Rx Refill/Question >> Jun 01, 2017  3:59 PM Mickel Baas B, NT wrote: Medication: propranolol (INDERAL) 10 MG tablet Has the patient contacted their pharmacy? Yes.   (Agent: If no, request that the patient contact the pharmacy for the refill.) Preferred Pharmacy (with phone number or street name): [id number] - [place], [place] - [address] Agent: Please be advised that RX refills may take up to 3 business days. We ask that you follow-up with your pharmacy.  Patient states he is currently out of this medication.

## 2017-06-02 ENCOUNTER — Other Ambulatory Visit: Payer: Self-pay | Admitting: *Deleted

## 2017-06-02 MED ORDER — PROPRANOLOL HCL 10 MG PO TABS: 10.0000 mg | ORAL_TABLET | Freq: Three times a day (TID) | ORAL | 1 refills | Status: DC | PRN

## 2017-06-02 NOTE — Progress Notes (Signed)
Cardiology Office Note   Date:  06/04/2017   ID:  Jonathan Spencer, Jonathan Spencer [birth year], MRN [id number]  PCP:  Jarold Motto, PA  Cardiologist:  Chief Complaint  Patient presents with  . New Patient (Initial Visit)    pt complaints of papitations; with medications has improved, pt denies SOB, swelling in hands/feet     History of Present Illness: Jonathan Spencer is a 27 y.o. male who presents for cardiology evaluation at the request of Jarold Motto, physician assistant, in the setting of palpitations.  The patient had denied any chest pain, dyspnea, chest fullness, or anxiety.  The patient is stating that the symptoms are waxing and waning and occurring throughout the day.  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  He is engaged to be married April 2020, he has a 40-month-old daughter.  He has no prior history of coronary artery disease or structural heart disease.  The patient has a prior history of excessive alcohol use drinking 2-3 mixed drinks daily.  He on last office visit on 05/11/2017 with Ms. Farrell Ours he had stopped drinking for 5 days.  He is also cutting down on caffeine use.  Was also noted that he has issues with snoring and daytime somnolence but had never been contacted to schedule sleep study. This scheduled for for the first week of June. The patient apparently had also gained approximately 20 pounds in 7 months.   He was placed on propanolol to use prn for palpitations. He is taking them once a day.  He denies dizziness, dyspnea, blurred vision, or significant fatigue.    Past Medical History:  Diagnosis Date  . Abdominal pain, unspecified site   . Acid reflux   . Chest pain   . Palpitation     History reviewed. No pertinent surgical history.   Current Outpatient Medications  Medication Sig Dispense Refill  . Multiple Vitamins-Minerals (MULTI-VITAMIN GUMMIES PO) Take 2 each by mouth daily.    Marland Kitchen omeprazole (PRILOSEC  OTC) 20 MG tablet Take 1 tablet (20 mg total) by mouth daily. 90 tablet 0  . propranolol (INDERAL) 10 MG tablet Take 1 tablet (10 mg total) by mouth 3 (three) times daily as needed. 270 tablet 0   No current facility-administered medications for this visit.     Allergies:   Patient has no known allergies.    Social History:  The patient  reports that he has quit smoking. His smokeless tobacco use includes snuff. He reports that he drinks alcohol. He reports that he does not use drugs.   Family History:  The patient's family history includes Alcohol abuse in his paternal grandfather and paternal grandmother; COPD in his paternal grandmother; Cancer in his maternal grandfather and paternal grandfather; Drug abuse in his paternal grandfather; Hearing loss in his maternal grandmother; Heart disease in his maternal grandmother; Heart disease (age of onset: 17) in his mother.    ROS: All other systems are reviewed and negative. Unless otherwise mentioned in H&P    PHYSICAL EXAM: VS:  BP 130/87 (BP Location: Right Arm)   Pulse 71   Ht  (1.88 m)   Wt 241 lb 3.2 oz (109.4 kg)   BMI 30.97 kg/m  , BMI Body mass index is 30.97 kg/m. GEN: Well nourished, well developed, in no acute distress  HEENT: normal  Neck: no JVD, carotid bruits, or masses Cardiac: RRR; no murmurs, rubs, or gallops,no edema  Respiratory:  clear to auscultation bilaterally, normal  work of breathing GI: soft, nontender, nondistended, + BS MS: no deformity or atrophy  Skin: warm and dry, no rash Neuro:  Strength and sensation are intact Psych: euthymic mood, full affect   EKG: Normal sinus rhythm, heart rate of 71 bpm  Recent Labs: 05/11/2017: ALT 59; BUN 9; Creatinine, Ser 0.84; Hemoglobin 15.8; Platelets 216.0; Potassium 3.7; Sodium 139; TSH 1.12    Lipid Panel No results found for: CHOL, TRIG, HDL, CHOLHDL, VLDL, LDLCALC, LDLDIRECT    Wt Readings from Last 3 Encounters:  06/04/17 241 lb 3.2 oz (109.4 kg)    05/11/17 239 lb 8 oz (108.6 kg)  10/07/16 221 lb (100.2 kg)      Other studies Reviewed Labs reviewed from epic.   ASSESSMENT AND PLAN:  1.  Frequent palpitations: Patient has daily palpitations described as racing heart or hard thumping.  He has been taking propanolol which she can take up to 30 mg 3 times a day he is now only taking it once a day but had been taking it 3 times a day initially.  He denies any associated symptoms.  I will place a 48-hour Holter monitor to evaluate for frequency morphology and duration of arrhythmias.  We will also check magnesium and BMET today.  If Holter abnormal can consider echocardiogram.   2.  GERD: Patient is on a PPI which has been known to decrease magnesium levels in some patients.  Check a magnesium today he may need supplement if he needs to continue on PPI therapy.  3.  Unknown lipid status: Lipids and LFTs will be checked for baseline information and to provide risk stratification.  4.  Tobacco abuse: The patient dips snuff daily.  He is a former smoker.  5.  Possible sleep apnea: Sleep study has been ordered and scheduled for the first week of June by PCP.  6. ETOH Use: Daily use of two alcoholic drinks a day. Will need to follow for increased consumption.   Current medicines are reviewed at length with the patient today.  I have discussed this with Dr. Nicki Guadalajara, who is DOD at the [facility] office today.  He is reviewed the patient's chart and is agreement with my assessment and plan.  Labs/ tests ordered today include:  BMET, Mg and 48 hour Holter monitor    Bettey Mare. Liborio Nixon, ANP, AACC   06/04/2017 9:32 AM    [facility] [address], [place], [place] [address] Phone: [phone]; Fax: [phone]

## 2017-06-04 ENCOUNTER — Encounter: Payer: Self-pay | Admitting: Adult Health

## 2017-06-04 ENCOUNTER — Ambulatory Visit (INDEPENDENT_AMBULATORY_CARE_PROVIDER_SITE_OTHER): Payer: BLUE CROSS/BLUE SHIELD | Admitting: Adult Health

## 2017-06-04 VITALS — BP 130/87 | HR 71 | Ht 74.0 in | Wt 241.2 lb

## 2017-06-04 DIAGNOSIS — R002 Palpitations: Secondary | ICD-10-CM | POA: Diagnosis not present

## 2017-06-04 DIAGNOSIS — R079 Chest pain, unspecified: Secondary | ICD-10-CM | POA: Diagnosis not present

## 2017-06-04 DIAGNOSIS — Z79899 Other long term (current) drug therapy: Secondary | ICD-10-CM

## 2017-06-04 LAB — HEPATIC FUNCTION PANEL
ALT: 72 IU/L — ABNORMAL HIGH (ref 0–44)
AST: 32 IU/L (ref 0–40)
Albumin: 4.7 g/dL (ref 3.5–5.5)
Alkaline Phosphatase: 72 IU/L (ref 39–117)
Bilirubin Total: 0.2 mg/dL (ref 0.0–1.2)
Bilirubin, Direct: 0.09 mg/dL (ref 0.00–0.40)
Total Protein: 7.2 g/dL (ref 6.0–8.5)

## 2017-06-04 MED ORDER — OMEPRAZOLE MAGNESIUM 20 MG PO TBEC: 20.0000 mg | DELAYED_RELEASE_TABLET | Freq: Every day | ORAL | 0 refills | Status: AC

## 2017-06-04 MED ORDER — PROPRANOLOL HCL 10 MG PO TABS: 10.0000 mg | ORAL_TABLET | Freq: Three times a day (TID) | ORAL | 0 refills | Status: DC | PRN

## 2017-06-04 NOTE — Patient Instructions (Signed)
Medication Instructions:  NO CHANGES- Your physician recommends that you continue on your current medications as directed. Please refer to the Current Medication list given to you today.  If you need a refill on your cardiac medications before your next appointment, please call your pharmacy.  Labwork: BMET,LFT, LIPID AND MAG TODAY HERE IN OUR OFFICE AT [facility]  Take the provided lab slips with you to the lab for your blood draw.   Testing/Procedures: Your physician has recommended that you wear a holter monitor. Holter monitors are medical devices that record the heart's electrical activity. Doctors most often use these monitors to diagnose arrhythmias. Arrhythmias are problems with the speed or rhythm of the heartbeat. The monitor is a small, portable device. You can wear one while you do your normal daily activities. This is usually used to diagnose what is causing palpitations/syncope (passing out).  Follow-Up: Your physician wants you to follow-up in: 1 MONTH WITH KELLY -OR- KATHRYN LAWRENCE (NURSE PRACTIONIER), DNP,AACC IF PRIMARY CARDIOLOGIST IS UNAVAILABLE.    Thank you for choosing [facility] at [facility]!!

## 2017-06-16 ENCOUNTER — Ambulatory Visit (INDEPENDENT_AMBULATORY_CARE_PROVIDER_SITE_OTHER): Payer: BLUE CROSS/BLUE SHIELD

## 2017-06-16 DIAGNOSIS — R002 Palpitations: Secondary | ICD-10-CM

## 2017-06-16 DIAGNOSIS — R079 Chest pain, unspecified: Secondary | ICD-10-CM

## 2017-07-09 ENCOUNTER — Encounter: Payer: Self-pay | Admitting: Neurology

## 2017-07-09 ENCOUNTER — Ambulatory Visit (INDEPENDENT_AMBULATORY_CARE_PROVIDER_SITE_OTHER): Payer: BLUE CROSS/BLUE SHIELD | Admitting: Neurology

## 2017-07-09 VITALS — BP 150/90 | HR 78 | Ht 75.0 in | Wt 241.0 lb

## 2017-07-09 DIAGNOSIS — E669 Obesity, unspecified: Secondary | ICD-10-CM

## 2017-07-09 DIAGNOSIS — R0683 Snoring: Secondary | ICD-10-CM | POA: Diagnosis not present

## 2017-07-09 DIAGNOSIS — R002 Palpitations: Secondary | ICD-10-CM | POA: Diagnosis not present

## 2017-07-09 DIAGNOSIS — Z820 Family history of epilepsy and other diseases of the nervous system: Secondary | ICD-10-CM

## 2017-07-09 DIAGNOSIS — G2581 Restless legs syndrome: Secondary | ICD-10-CM

## 2017-07-09 DIAGNOSIS — G4719 Other hypersomnia: Secondary | ICD-10-CM | POA: Diagnosis not present

## 2017-07-09 NOTE — Patient Instructions (Signed)

## 2017-07-09 NOTE — Progress Notes (Signed)
Subjective:    Patient ID: Jonathan Spencer is a 27 y.o. male.  HPI     Jonathan FoleySaima Hriday Stai, MD, PhD [facility] [address], [address] [address] [place], [place] [address]  Dear Lelon MastSamantha,  I saw your patient, Jonathan Spencer, upon your kind request, in my neurologic clinic today for initial consultation of his sleep disorder, in particular, concern for obstructive sleep apnea. The patient is unaccompanied today. As you know, Jonathan Spencer is a 27 year old right-handed gentleman with an underlying medical history of reflux disease, history of chest pain and palpitations as well as borderline obesity, who reports snoring and excessive daytime somnolence. He has witnessed apneas. His Epworth sleepiness score is 14 out of 24 today, fatigue score is 45 out of 63. He lives with his fiance, he has 1 child. He works for [employer]. He uses smokeless tobacco, drinks alcohol 1-2 drinks per day on average, caffeine in the form of diet soda about 3-4 per day on average. I reviewed your office note from 05/11/2017. His bedtime is generally between 11 and 11:30. He does not have nighttime nocturia and denies morning headaches. He does have early rise time because his 4962-month-old daughter wakes up between 5 and 6. Generally, he could sleep until 7. His father has sleep apnea who was diagnosed about 20 years ago. He is supposed to be on a CPAP but could not tolerate it at the time. Patient has occasional symptoms of restless legs during the day, is not aware of any leg movements at night. His snoring has been loud enough to disturb his fiance. He tried an over-the-counter mouth piece for sleep apnea treatment but could not tolerate it. His weight has increased in the past few years.  His Past Medical History Is Significant For: Past Medical History:  Diagnosis Date  . Abdominal pain, unspecified site   . Acid reflux   . Chest pain   . Palpitation    His Past Surgical  History Is Significant For: No past surgical history on file.  His Family History Is Significant For: Family History  Problem Relation Age of Onset  . Heart disease Mother [id number]       scad  . Heart disease Maternal Grandmother   . Hearing loss Maternal Grandmother   . Cancer Maternal Grandfather        lung  . Alcohol abuse Paternal Grandmother   . COPD Paternal Grandmother   . Alcohol abuse Paternal Grandfather   . Cancer Paternal Grandfather        prostate and bone  . Drug abuse Paternal Grandfather     His Social History Is Significant For: Social History   Socioeconomic History  . Marital status: Single    Spouse name: Not on file  . Number of children: Not on file  . Years of education: Not on file  . Highest education level: Not on file  Occupational History  . Occupation: Community education officercar salesman    Comment: Airline pilotales, [employer]   Social Needs  . Financial resource strain: Not on file  . Food insecurity:    Worry: Not on file    Inability: Not on file  . Transportation needs:    Medical: Not on file    Non-medical: Not on file  Tobacco Use  . Smoking status: Former Games developermoker  . Smokeless tobacco: Current User    Types: Snuff  . Tobacco comment: very little, less than a pack per week, socially  Substance  and Sexual Activity  . Alcohol use: Yes    Alcohol/week: 0.0 oz    Comment: 10-15 per week  . Drug use: No  . Sexual activity: Yes  Lifestyle  . Physical activity:    Days per week: Not on file    Minutes per session: Not on file  . Stress: Not on file  Relationships  . Social connections:    Talks on phone: Not on file    Gets together: Not on file    Attends religious service: Not on file    Active member of club or organization: Not on file    Attends meetings of clubs or organizations: Not on file    Relationship status: Not on file  Other Topics Concern  . Not on file  Social History Narrative   Engaged, lives with fiance   Work in Research scientist (life sciences) sales   97  month old girl, Renea Ee    His Allergies Are:  No Known Allergies:   His Current Medications Are:  Outpatient Encounter Medications as of 07/09/2017  Medication Sig  . Multiple Vitamins-Minerals (MULTI-VITAMIN GUMMIES PO) Take 2 each by mouth daily.  Marland Kitchen omeprazole (PRILOSEC OTC) 20 MG tablet Take 1 tablet (20 mg total) by mouth daily.  . propranolol (INDERAL) 10 MG tablet Take 1 tablet (10 mg total) by mouth 3 (three) times daily as needed.   No facility-administered encounter medications on file as of 07/09/2017.   :  Review of Systems:  Out of a complete 14 point review of systems, all are reviewed and negative with the exception of these symptoms as listed below: Review of Systems  Neurological:       Pt presents today to discuss his sleep. Pt has never had a sleep study but does endorse snoring.  Epworth Sleepiness Scale 0= would never doze 1= slight chance of dozing 2= moderate chance of dozing 3= high chance of dozing  Sitting and reading: 1 Watching TV: 1 Sitting inactive in a public place (ex. Theater or meeting): 2 As a passenger in a car for an hour without a break: 3 Lying down to rest in the afternoon: 3 Sitting and talking to someone: 1 Sitting quietly after lunch (no alcohol): 2 In a car, while stopped in traffic: 1 Total: 14     Objective:  Neurological Exam  Physical Exam Physical Examination:   Vitals:   07/09/17 0923  BP: (!) 150/90  Pulse: 78    General Examination: The patient is a very pleasant 27 y.o. male in no acute distress. He appears well-developed and well-nourished and well groomed.   HEENT: Normocephalic, atraumatic, pupils are equal, round and reactive to light and accommodation. Funduscopic exam is normal with sharp disc margins noted. Extraocular tracking is good without limitation to gaze excursion or nystagmus noted. Normal smooth pursuit is noted. Hearing is grossly intact. Tympanic membranes are clear bilaterally. Face is symmetric  with normal facial animation and normal facial sensation. Speech is clear with no dysarthria noted. There is no hypophonia. There is no lip, neck/head, jaw or voice tremor. Neck is supple with full range of passive and active motion. There are no carotid bruits on auscultation. Oropharynx exam reveals: mild mouth dryness, good dental hygiene and moderate airway crowding, due to larger uvula, tonsils of 1-2+, larger tongue. Mallampati is class II. Tongue protrudes centrally and palate elevates symmetrically. Neck size is 17.5 inches. He has a Mild overbite. Nasal inspection reveals no significant nasal mucosal bogginess or redness and no  septal deviation.   Chest: Clear to auscultation without wheezing, rhonchi or crackles noted.  Heart: S1+S2+0, regular and normal without murmurs, rubs or gallops noted.   Abdomen: Soft, non-tender and non-distended with normal bowel sounds appreciated on auscultation.  Extremities: There is no pitting edema in the distal lower extremities bilaterally. Pedal pulses are intact.  Skin: Warm and dry without trophic changes noted. There are no varicose veins.  Musculoskeletal: exam reveals no obvious joint deformities, tenderness or joint swelling or erythema.   Neurologically:  Mental status: The patient is awake, alert and oriented in all 4 spheres. His immediate and remote memory, attention, language skills and fund of knowledge are appropriate. There is no evidence of aphasia, agnosia, apraxia or anomia. Speech is clear with normal prosody and enunciation. Thought process is linear. Mood is normal and affect is normal.  Cranial nerves II - XII are as described above under HEENT exam. In addition: shoulder shrug is normal with equal shoulder height noted. Motor exam: Normal bulk, strength and tone is noted. There is no drift, tremor or rebound. Romberg is negative. Reflexes are 2+ throughout. Fine motor skills and coordination: intact grossly.   Cerebellar testing:  No dysmetria or intention tremor on finger to nose testing. Heel to shin is unremarkable bilaterally. There is no truncal or gait ataxia.  Sensory exam: intact to light touch in the upper and lower extremities.  Gait, station and balance: He stands easily. No veering to one side is noted. No leaning to one side is noted. Posture is age-appropriate and stance is narrow based. Gait shows normal stride length and normal pace. No problems turning. Tandem walk is unremarkable.   Assessment and Plan:  In summary, Derris Millan is a very pleasant 27 y.o. male with an underlying medical history of reflux disease, history of chest pain and palpitations as well as borderline obesity, whose history and physical exam are concerning for obstructive sleep apnea (OSA).   I had a long chat with the patient about my findings and the diagnosis of OSA, its prognosis and treatment options. We talked about medical treatments, surgical interventions and non-pharmacological approaches. I explained in particular the risks and ramifications of untreated moderate to severe OSA, especially with respect to developing cardiovascular disease down the Road, including congestive heart failure, difficult to treat hypertension, cardiac arrhythmias, or stroke. Even type 2 diabetes has, in part, been linked to untreated OSA. Symptoms of untreated OSA include daytime sleepiness, memory problems, mood irritability and mood disorder such as depression and anxiety, lack of energy, as well as recurrent headaches, especially morning headaches. We talked about trying to maintain a healthy lifestyle in general, as well as the importance of weight control. I encouraged the patient to eat healthy, exercise daily and keep well hydrated, to keep a scheduled bedtime and wake time routine, to not skip any meals and eat healthy snacks in between meals. I advised the patient not to drive when feeling sleepy. I recommended the following at this time:  sleep study with potential positive airway pressure titration. (We will score hypopneas at 3%).   I explained the sleep test procedure to the patient and also outlined possible surgical and non-surgical treatment options of OSA, including the use of a custom-made dental device (which would require a referral to a specialist dentist or oral surgeon), upper airway surgical options, such as pillar implants, radiofrequency surgery, tongue base surgery, and UPPP (which would involve a referral to an ENT surgeon). Rarely, jaw surgery such  as mandibular advancement may be considered.  I also explained the CPAP treatment option to the patient, who indicated that he would be willing to try CPAP if the need arises. I explained the importance of being compliant with PAP treatment, not only for insurance purposes but primarily to improve His symptoms, and for the patient's long term health benefit, including to reduce His cardiovascular risks. I answered all his questions today and the patient was in agreement. I plan to see him back after the sleep study is completed and encouraged him to call with any interim questions, concerns, problems or updates.   Thank you very much for allowing me to participate in the care of this nice patient. If I can be of any further assistance to you please do not hesitate to call me at [phone].  Sincerely,   Jonathan Foley, MD, PhD

## 2017-07-12 NOTE — Progress Notes (Signed)
Cardiology Office Note   Date:  07/13/2017   ID:  Jonathan Spencer, [place] 1990/09/05, MRN [id number]  PCP:  Jonathan Motto, PA  Cardiologist:  Jonathan Spencer  Chief Complaint  Patient presents with  . Follow-up    still having palpitations worsens with the heat     History of Present Illness: Jonathan Spencer is a 27 y.o. male who presents for ongoing assessment and management of chest discomfort, palpitations, as follow-up after being seen as new patient on 06/04/2017 and assigned to Dr. Tresa Spencer.  (The patient has not been seen by Dr. Tresa Spencer in person yet).  On last office visit a 48-hour Holter monitor was placed to evaluate palpitations, magnesium and BMET were also ordered, lipids and LFTs for baseline status were also ordered.  Since being seen last, the patient has been seen by neurology, Jonathan Spencer, who is recommending sleep study due to symptoms concerning for this.  Sleep study has been ordered with potential positive airway pressure titration.  At the time of this office visit, labs had not been completed.    Holter monitor did not reveal any significant palpitations, average heart rate was 75 bpm, one episode of bradycardia at 45 bpm and in the early morning hours, and one episode of rapid heart rhythm at 125 bpm.  There was no atrial fibrillation, rare PACs and PVCs.  He comes today with complaints of palpitations usually associated with being in the heat. He states that he notices them more in that environment than any other, He denies chest pain or dizziness associated with it.   Past Medical History:  Diagnosis Date  . Abdominal pain, unspecified site   . Acid reflux   . Chest pain   . Palpitation     No past surgical history on file.   Current Outpatient Medications  Medication Sig Dispense Refill  . Multiple Vitamins-Minerals (MULTI-VITAMIN GUMMIES PO) Take 2 each by mouth daily.    Marland Kitchen omeprazole (PRILOSEC OTC) 20 MG tablet Take 1 tablet (20 mg total) by mouth daily. 90  tablet 0   No current facility-administered medications for this visit.     Allergies:   Patient has no known allergies.    Social History:  The patient  reports that he has quit smoking. His smokeless tobacco use includes snuff. He reports that he drinks alcohol. He reports that he does not use drugs.   Family History:  The patient's family history includes Alcohol abuse in his paternal grandfather and paternal grandmother; COPD in his paternal grandmother; Cancer in his maternal grandfather and paternal grandfather; Drug abuse in his paternal grandfather; Hearing loss in his maternal grandmother; Heart disease in his maternal grandmother; Heart disease (age of onset: 55) in his mother.    ROS: All other systems are reviewed and negative. Unless otherwise mentioned in H&P    PHYSICAL EXAM: VS:  BP 129/72 (BP Location: Left Arm)   Pulse 75   Ht 6\' 3"  (1.905 m)   Wt 242 lb 6.4 oz (110 kg)   BMI 30.30 kg/m  , BMI Body mass index is 30.3 kg/m. GEN: Well nourished, well developed, in no acute distress  HEENT: normal  Neck: no JVD, carotid bruits, or masses Cardiac: RRR; no murmurs, rubs, or gallops,no edema  Respiratory:  clear to auscultation bilaterally, normal work of breathing GI: soft, nontender, nondistended, + BS MS: no deformity or atrophy  Skin: warm and dry, no rash Neuro:  Strength and sensation are intact Psych: euthymic mood, full affect  EKG:  Not completed during this visit.   Recent Labs: 05/11/2017: BUN 9; Creatinine, Ser 0.84; Hemoglobin 15.8; Platelets 216.0; Potassium 3.7; Sodium 139; TSH 1.12 06/04/2017: ALT 72    Lipid Panel No results found for: CHOL, TRIG, HDL, CHOLHDL, VLDL, LDLCALC, LDLDIRECT    Wt Readings from Last 3 Encounters:  07/13/17 242 lb 6.4 oz (110 kg)  07/09/17 241 lb (109.3 kg)  06/04/17 241 lb 3.2 oz (109.4 kg)      Other studies Reviewed: Study Highlights 48 HR Holter 06/16/2017 The patient was monitored for 48 hours commencing  on 06/16/2017. The predominant rhythm was normal sinus rhythm with an average rate at 78 bpm.  The slowest heart rate was sinus bradycardia at 45 bpm which occurred at 6:39 AM.  The fastest heart rate was sinus tachycardia at 125 bpm.  There were very rare PACs totaling 16 and rare isolated PVCs totaling 38.  There were no couplets or episodes of atrial or ventricular runs.  There was no atrial fibrillation.  There were no pauses.     ASSESSMENT AND PLAN:  1. Palpitations: He has not been taking propanolol consistently. He needs a refill now., I have offered low dose metoprolol 12.5 mg but he prefers to only take prn propanolol. I have given him a refill for this. I will have echo completed for structural heart disease, He will have Mg and lipids checked as they were not completed during last office visit, even though blood was drawn. Re-ordering. Holter monitor was reviewed with patient. He states that he had more palpitations after monitor was removed compared to when he was wearing it. Can place longer cardiac monitor if this becomes more worrisome for him.   2. OSA: High suspicion for this due to symptoms. This is being followed by neurology.    Current medicines are reviewed at length with the patient today.    Labs/ tests ordered today include: Echocardiogram lipids and Mg  Jonathan MareKathryn M. Liborio NixonLawrence DNP, ANP, AACC   07/13/2017 8:30 AM    [facility] [address], [place], [place] [address] Phone: [phone]; Fax: [phone]

## 2017-07-13 ENCOUNTER — Encounter: Payer: Self-pay | Admitting: Adult Health

## 2017-07-13 ENCOUNTER — Ambulatory Visit (INDEPENDENT_AMBULATORY_CARE_PROVIDER_SITE_OTHER): Payer: BLUE CROSS/BLUE SHIELD | Admitting: Adult Health

## 2017-07-13 VITALS — BP 129/72 | HR 75 | Ht 75.0 in | Wt 242.4 lb

## 2017-07-13 DIAGNOSIS — E7800 Pure hypercholesterolemia, unspecified: Secondary | ICD-10-CM | POA: Diagnosis not present

## 2017-07-13 DIAGNOSIS — R002 Palpitations: Secondary | ICD-10-CM | POA: Diagnosis not present

## 2017-07-13 DIAGNOSIS — R000 Tachycardia, unspecified: Secondary | ICD-10-CM

## 2017-07-13 DIAGNOSIS — Z79899 Other long term (current) drug therapy: Secondary | ICD-10-CM | POA: Diagnosis not present

## 2017-07-13 LAB — BASIC METABOLIC PANEL
BUN/Creatinine Ratio: 19 (ref 9–20)
BUN: 17 mg/dL (ref 6–20)
CALCIUM: 9.5 mg/dL (ref 8.7–10.2)
CHLORIDE: 101 mmol/L (ref 96–106)
CO2: 23 mmol/L (ref 20–29)
Creatinine, Ser: 0.91 mg/dL (ref 0.76–1.27)
GFR calc Af Amer: 132 mL/min/{1.73_m2} (ref >59)
GFR, EST NON AFRICAN AMERICAN: 114 mL/min/{1.73_m2} (ref >59)
Glucose: 88 mg/dL (ref 65–99)
POTASSIUM: 4.1 mmol/L (ref 3.5–5.2)
Sodium: 139 mmol/L (ref 134–144)

## 2017-07-13 LAB — LIPID PANEL
CHOL/HDL RATIO: 5.1 ratio — AB (ref 0.0–5.0)
Cholesterol, Total: 248 mg/dL — ABNORMAL HIGH (ref 100–199)
HDL: 49 mg/dL (ref >39)
LDL Calculated: 144 mg/dL — ABNORMAL HIGH (ref 0–99)
Triglycerides: 277 mg/dL — ABNORMAL HIGH (ref 0–149)
VLDL CHOLESTEROL CAL: 55 mg/dL — AB (ref 5–40)

## 2017-07-13 LAB — MAGNESIUM: MAGNESIUM: 2.1 mg/dL (ref 1.6–2.3)

## 2017-07-13 MED ORDER — PROPRANOLOL HCL 10 MG PO TABS: 10.0000 mg | ORAL_TABLET | Freq: Two times a day (BID) | ORAL | 6 refills | Status: DC | PRN

## 2017-07-13 NOTE — Patient Instructions (Signed)
Medication Instructions:  RESTART PROPRANOLOL TWICE DAILY-AS NEEDED  If you need a refill on your cardiac medications before your next appointment, please call your pharmacy.  Labwork: LIPID,BMET AND MAG TODAY HERE IN OUR OFFICE AT [facility]  Take the provided lab slips with you to the lab for your blood draw.   Testing/Procedures: Echocardiogram - Your physician has requested that you have an echocardiogram. Echocardiography is a painless test that uses sound waves to create images of your heart. It provides your doctor with information about the size and shape of your heart and how well your heart's chambers and valves are working. This procedure takes approximately one hour. There are no restrictions for this procedure. This [facility] be performed at [facility] location - [address], [address].  Follow-Up: Your physician wants you to follow-up in: 6 MONTHS WITH DR Burnett ShengKELLY-ONLY You should receive a reminder letter in the mail two months in advance. If you do not receive a letter, please call our office 11-2017 to schedule the 01-2018 follow-up appointment.   Thank you for choosing [facility] at [facility]!!

## 2017-07-14 ENCOUNTER — Other Ambulatory Visit: Payer: Self-pay

## 2017-07-14 DIAGNOSIS — Z79899 Other long term (current) drug therapy: Secondary | ICD-10-CM

## 2017-07-14 MED ORDER — ATORVASTATIN CALCIUM 40 MG PO TABS: 40.0000 mg | ORAL_TABLET | Freq: Every day | ORAL | 3 refills | Status: DC

## 2017-07-28 ENCOUNTER — Other Ambulatory Visit (HOSPITAL_COMMUNITY): Payer: BLUE CROSS/BLUE SHIELD

## 2017-08-02 ENCOUNTER — Ambulatory Visit (INDEPENDENT_AMBULATORY_CARE_PROVIDER_SITE_OTHER): Payer: BLUE CROSS/BLUE SHIELD | Admitting: Neurology

## 2017-08-02 DIAGNOSIS — R002 Palpitations: Secondary | ICD-10-CM

## 2017-08-02 DIAGNOSIS — G4733 Obstructive sleep apnea (adult) (pediatric): Secondary | ICD-10-CM

## 2017-08-02 DIAGNOSIS — R0683 Snoring: Secondary | ICD-10-CM

## 2017-08-02 DIAGNOSIS — G4719 Other hypersomnia: Secondary | ICD-10-CM

## 2017-08-02 DIAGNOSIS — E669 Obesity, unspecified: Secondary | ICD-10-CM

## 2017-08-02 DIAGNOSIS — Z820 Family history of epilepsy and other diseases of the nervous system: Secondary | ICD-10-CM

## 2017-08-02 DIAGNOSIS — G2581 Restless legs syndrome: Secondary | ICD-10-CM

## 2017-08-19 ENCOUNTER — Encounter: Payer: Self-pay | Admitting: Physician Assistant

## 2017-08-19 ENCOUNTER — Telehealth: Payer: Self-pay

## 2017-08-19 DIAGNOSIS — G4733 Obstructive sleep apnea (adult) (pediatric): Secondary | ICD-10-CM | POA: Insufficient documentation

## 2017-08-19 NOTE — Telephone Encounter (Signed)
I called pt to discuss his sleep study results, no answer, VM full. Will try again later.

## 2017-08-19 NOTE — Telephone Encounter (Signed)
I called pt. I advised pt that Dr. Frances FurbishAthar reviewed their sleep study results and found that pt has severe osa but did well with the cpap during the sleep study with significant improvement in his respiratory events. Dr. Frances FurbishAthar recommends that pt start a cpap at home. I reviewed PAP compliance expectations with the pt. Pt is agreeable to starting a CPAP. I advised pt that an order will be sent to a DME, [facility], and [facility] will call the pt within about one week after they file with the pt's insurance. [facility] will show the pt how to use the machine, fit for masks, and troubleshoot the CPAP if needed. A follow up appt was made for insurance purposes with Dr. Frances FurbishAthar on 10/28/17 at 8:30am. Pt verbalized understanding to arrive 15 minutes early and bring their CPAP. A letter with all of this information in it will be mailed to the pt as a reminder. I verified with the pt that the address we have on file is correct. Pt verbalized understanding of results. Pt had no questions at this time but was encouraged to call back if questions arise.

## 2017-08-19 NOTE — Addendum Note (Signed)
Addended by: Huston FoleyATHAR, Tiny Rietz on: 08/19/2017 07:48 AM   Modules accepted: Orders

## 2017-08-19 NOTE — Progress Notes (Signed)
Patient referred by Jarold MottoSamantha Worley, PA, seen by me on 07/09/17, split night sleep study on 08/02/17. Please call and notify patient that the recent sleep study confirmed the diagnosis of severe OSA. He did very well with CPAP during the study with significant improvement of the respiratory events. Therefore, I would like start the patient on CPAP therapy at home by prescribing a machine for home use. I placed the order in the chart.  Please advise patient that we need a follow up appointment with either myself or one of our nurse practitioners in about 10 weeks post set-up to check for how the patient is feeling and how well the patient is using the machine, etc. Please go ahead and schedule the appointment, while you have the patient on the phone and make sure patient understands the importance of keeping this window for the FU appointment, as it is often an insurance requirement. Failing to adhere to this may result in losing coverage for sleep apnea treatment, at which point most patients are left with a choice of returning the machine or paying out of pocket (and we want neither of this to happen!).  Please re-enforce the importance of compliance with treatment and the need for us to monitor compliance data - again an insurance requirement and usually a good feedback for the patient as far as how they are doing.  Also remind patient, that any PAP machine or mask issues should be first addressed with the DME company, who provided the machine/mask.  Please ask if patient has a preference regarding DME company, may depend on the insurance too.  Please arrange for CPAP set up at home through a DME company of patient's choice.  Once you have spoken to the patient you can close the phone encounter. Please fax/route report to referring provider, thanks,   Huston FoleySaima Contrell Ballentine, MD, PhD [facility])

## 2017-08-19 NOTE — Telephone Encounter (Signed)
-----   Message from Huston FoleySaima Athar, MD sent at 08/19/2017  7:47 AM EDT ----- Patient referred by Jarold MottoSamantha Worley, PA, seen by me on 07/09/17, split night sleep study on 08/02/17. Please call and notify patient that the recent sleep study confirmed the diagnosis of severe OSA. He did very well with CPAP during the study with significant improvement of the respiratory events. Therefore, I would like start the patient on CPAP therapy at home by prescribing a machine for home use. I placed the order in the chart.  Please advise patient that we need a follow up appointment with either myself or one of our nurse practitioners in about 10 weeks post set-up to check for how the patient is feeling and how well the patient is using the machine, etc. Please go ahead and schedule the appointment, while you have the patient on the phone and make sure patient understands the importance of keeping this window for the FU appointment, as it is often an insurance requirement. Failing to adhere to this may result in losing coverage for sleep apnea treatment, at which point most patients are left with a choice of returning the machine or paying out of pocket (and we want neither of this to happen!).  Please re-enforce the importance of compliance with treatment and the need for us to monitor compliance data - again an insurance requirement and usually a good feedback for the patient as far as how they are doing.  Also remind patient, that any PAP machine or mask issues should be first addressed with the DME company, who provided the machine/mask.  Please ask if patient has a preference regarding DME company, may depend on the insurance too.  Please arrange for CPAP set up at home through a DME company of patient's choice.  Once you have spoken to the patient you can close the phone encounter. Please fax/route report to referring provider, thanks,   Huston FoleySaima Athar, MD, PhD [facility])

## 2017-08-19 NOTE — Procedures (Signed)
PATIENT'S NAME:  Jonathan Spencer, Jonathan Spencer DOB:      [birth year]      MR#:    [id number]     DATE OF RECORDING: 08/02/2017 REFERRING M.D.:  Jarold Motto, [place] Study Performed:  Split-Night Titration Study HISTORY: 27 year old man with a history of reflux disease, history of chest pain and palpitations as well as borderline obesity, who reports snoring and excessive daytime somnolence. The patient endorsed the Epworth Sleepiness Scale at 14/24 points. The patient's weight 241 pounds with a height of 75 (inches), resulting in a BMI of 29.9 kg/m2. The patient's neck circumference measured 17.5 inches.  CURRENT MEDICATIONS: Prilosec, Multivitamins, Inderal   PROCEDURE:  This is a multichannel digital polysomnogram utilizing the Somnostar 11.2 system.  Electrodes and sensors were applied and monitored per AASM Specifications.   EEG, EOG, Chin and Limb EMG, were sampled at 200 Hz.  ECG, Snore and Nasal Pressure, Thermal Airflow, Respiratory Effort, CPAP Flow and Pressure, Oximetry was sampled at 50 Hz. Digital video and audio were recorded.      BASELINE STUDY WITHOUT CPAP RESULTS:  Lights Out was at 22:28 and Lights On at 04:59 for the night, split start at 01:00, epoch 308.  Total recording time (TRT) was 152.5, with a total sleep time (TST) of 131.5 minutes.   The patient's sleep latency was 20 minutes.  REM latency was 76 minutes, which is normal. The sleep efficiency was 86.2 %.    SLEEP ARCHITECTURE: WASO (Wake after sleep onset) was 8.5 minutes, Stage N1 was 2.5 minutes, Stage N2 was 77 minutes, Stage N3 was 51.5 minutes and Stage R (REM sleep) was 0.5 minutes.  The percentages were Stage N1 1.9%, Stage N2 58.6%, Stage N3 39.2% and Stage R (REM sleep) near absent at 0.4%. The arousals were noted as: 40 were spontaneous, 0 were associated with PLMs, 53 were associated with respiratory events.  RESPIRATORY ANALYSIS:  There were a total of 82 respiratory events:  15 obstructive apneas, 0 central apneas and  0 mixed apneas with a total of 15 apneas and an apnea index (AI) of 6.8. There were 67 hypopneas with a hypopnea index of 30.6. The patient also had 0 respiratory event related arousals (RERAs).  Snoring was noted.     The total APNEA/HYPOPNEA INDEX (AHI) was 37.4 /hour and the total RESPIRATORY DISTURBANCE INDEX was 37.4 /hour.  0 events occurred in REM sleep and 134 events in NREM. The REM AHI was 0, /hour versus a non-REM AHI of 37.6 /hour. The patient spent 352.5 minutes sleep time in the supine position 0 minutes in non-supine. The supine AHI was 37.4 /hour versus a non-supine AHI of 0.0 /hour.  OXYGEN SATURATION & C02:  The wake baseline 02 saturation was 92%, with the lowest being 83%. Time spent below 89% saturation equaled 39 minutes.  PERIODIC LIMB MOVEMENTS: The patient had a total of 0 Periodic Limb Movements.  The Periodic Limb Movement (PLM) index was 0 /hour and the PLM Arousal index was 0 /hour. Audio and video analysis did not show any abnormal or unusual movements, behaviors, phonations or vocalizations. The patient took no bathroom breaks. The EKG was in keeping with normal sinus rhythm (NSR)  TITRATION STUDY WITH CPAP RESULTS:   The patient was fitted with a large Simplus FFM. CPAP was initiated at 5 cmH20 with heated humidity per AASM split night standards and pressure was advanced to 9 cmH20 because of hypopneas, apneas and desaturations.  At a PAP pressure of 9 cmH20,  there was a reduction of the AHI to 0/hour with supine REM sleep achieved and O2 nadir of 95%.   Total recording time (TRT) was 239.5 minutes, with a total sleep time (TST) of 221 minutes. The patient's sleep latency was 34 minutes. REM latency was 16.5 minutes.  The sleep efficiency was 92.3 %.    SLEEP ARCHITECTURE: Wake after sleep was 10 minutes, Stage N1 3.5 minutes, Stage N2 72 minutes, Stage N3 65 minutes and Stage R (REM sleep) 80.5 minutes. The percentages were: Stage N1 1.6%, Stage N2 32.6%, Stage N3  29.4% and Stage R (REM sleep) 36.4%, which is increased and in keeping with rebound. The arousals were noted as: 28 were spontaneous, 0 were associated with PLMs, 11 were associated with respiratory events.  RESPIRATORY ANALYSIS:  There were a total of 36 respiratory events: 16 obstructive apneas, 3 central apneas and 0 mixed apneas with a total of 19 apneas and an apnea index (AI) of 5.2. There were 17 hypopneas with a hypopnea index of 4.6 /hour. The patient also had 0 respiratory event related arousals (RERAs).      The total APNEA/HYPOPNEA INDEX  (AHI) was 9.8 /hour and the total RESPIRATORY DISTURBANCE INDEX was 9.8 /hour.  3 events occurred in REM sleep and 33 events in NREM. The REM AHI was 2.2 /hour versus a non-REM AHI of 14.1 /hour. REM sleep was achieved on a pressure of  cm/h2o (AHI was  .) The patient spent 100% of total sleep time in the supine position. The supine AHI was 9.8 /hour, versus a non-supine AHI of 0.0/hour.  OXYGEN SATURATION & C02:  The wake baseline 02 saturation was 97%, with the lowest being 87%. Time spent below 89% saturation equaled 2 minutes.  PERIODIC LIMB MOVEMENTS: The patient had a total of 0 Periodic Limb Movements. The Periodic Limb Movement (PLM) index was 0 /hour and the PLM Arousal index was 0 /hour.  Post-study, the patient indicated that sleep was the same as usual.  POLYSOMNOGRAPHY IMPRESSION :   1. Obstructive Sleep Apnea (OSA)      RECOMMENDATIONS:  1. This patient has severe obstructive sleep apnea and responded well on CPAP therapy. I will, therefore, start the patient on home CPAP treatment at a pressure of 9 cm via FFM, with heated humidity. The patient should be reminded to be fully compliant with PAP therapy to improve sleep related symptoms and decrease long term cardiovascular risks. Please note that untreated obstructive sleep apnea may carry additional perioperative morbidity. Patients with significant obstructive sleep apnea should  receive perioperative PAP therapy and the surgeons and particularly the anesthesiologist should be informed of the diagnosis and the severity of the sleep disordered breathing. 2. The patient should be cautioned not to drive, work at heights, or operate dangerous or heavy equipment when tired or sleepy. Review and reiteration of good sleep hygiene measures should be pursued with any patient. 3. The patient will be seen in follow-up in the [facility] clinic at [facility] for discussion of the test results, symptom and treatment compliance review, further management strategies, etc. The referring provider will be notified of the test results.  I certify that I have reviewed the entire raw data recording prior to the issuance of this report in accordance with the Standards of Accreditation of the American Academy of Sleep Medicine (AASM)   Huston FoleySaima Nigel Wessman, MD, PhD Diplomat, American Board of Neurology and Sleep Medicine (Neurology and Sleep Medicine)

## 2017-08-25 DIAGNOSIS — G4733 Obstructive sleep apnea (adult) (pediatric): Secondary | ICD-10-CM | POA: Diagnosis not present

## 2017-09-01 ENCOUNTER — Telehealth: Payer: Self-pay | Admitting: Adult Health

## 2017-09-01 DIAGNOSIS — Z5181 Encounter for therapeutic drug level monitoring: Secondary | ICD-10-CM

## 2017-09-01 DIAGNOSIS — E7800 Pure hypercholesterolemia, unspecified: Secondary | ICD-10-CM

## 2017-09-01 NOTE — Telephone Encounter (Signed)
Spoke with patient and he does need repeat LP/HFP since starting Atorvastatin. Patient would prefer to go to PCP, orders placed.

## 2017-09-01 NOTE — Telephone Encounter (Signed)
New Message:      Pt is calling and states he was told he needed to come back in 6 wks to get his cholesterol checked. Pt states is he needing to come back here or go to his pcp to get this done

## 2017-09-04 ENCOUNTER — Telehealth: Payer: Self-pay | Admitting: *Deleted

## 2017-09-04 NOTE — Telephone Encounter (Signed)
Tried calling twice but the mailbox is full. Will try again on Monday.

## 2017-09-04 NOTE — Telephone Encounter (Signed)
Copied from CRM [phone]. Topic: Appointment Scheduling - Scheduling Inquiry for Clinic >> Sep 04, 2017 11:04 AM Burchel, Abbi R wrote: Reason for CRM:   Pt is requesting labs to check Cholesterol.  Please call to advise.    Pt: [phone]

## 2017-09-04 NOTE — Telephone Encounter (Signed)
Please call pt and schedule lab appointment orders are already in by Cardiology just needs appt.

## 2017-09-25 DIAGNOSIS — G4733 Obstructive sleep apnea (adult) (pediatric): Secondary | ICD-10-CM | POA: Diagnosis not present

## 2017-10-09 DIAGNOSIS — G4733 Obstructive sleep apnea (adult) (pediatric): Secondary | ICD-10-CM | POA: Diagnosis not present

## 2017-10-26 DIAGNOSIS — G4733 Obstructive sleep apnea (adult) (pediatric): Secondary | ICD-10-CM | POA: Diagnosis not present

## 2017-10-28 ENCOUNTER — Telehealth: Payer: Self-pay

## 2017-10-28 ENCOUNTER — Ambulatory Visit: Payer: Self-pay | Admitting: Neurology

## 2017-10-28 NOTE — Telephone Encounter (Signed)
Pt did not show for their appt with Dr. Athar today.  

## 2017-10-29 ENCOUNTER — Encounter: Payer: Self-pay | Admitting: Neurology

## 2017-11-25 DIAGNOSIS — G4733 Obstructive sleep apnea (adult) (pediatric): Secondary | ICD-10-CM | POA: Diagnosis not present

## 2017-12-26 DIAGNOSIS — G4733 Obstructive sleep apnea (adult) (pediatric): Secondary | ICD-10-CM | POA: Diagnosis not present

## 2018-01-25 DIAGNOSIS — G4733 Obstructive sleep apnea (adult) (pediatric): Secondary | ICD-10-CM | POA: Diagnosis not present

## 2018-02-25 DIAGNOSIS — G4733 Obstructive sleep apnea (adult) (pediatric): Secondary | ICD-10-CM | POA: Diagnosis not present

## 2018-03-03 DIAGNOSIS — G4733 Obstructive sleep apnea (adult) (pediatric): Secondary | ICD-10-CM | POA: Diagnosis not present

## 2018-03-12 DIAGNOSIS — G4733 Obstructive sleep apnea (adult) (pediatric): Secondary | ICD-10-CM | POA: Diagnosis not present

## 2018-03-28 DIAGNOSIS — G4733 Obstructive sleep apnea (adult) (pediatric): Secondary | ICD-10-CM | POA: Diagnosis not present

## 2018-04-26 DIAGNOSIS — G4733 Obstructive sleep apnea (adult) (pediatric): Secondary | ICD-10-CM | POA: Diagnosis not present

## 2018-05-19 ENCOUNTER — Ambulatory Visit (INDEPENDENT_AMBULATORY_CARE_PROVIDER_SITE_OTHER): Payer: BLUE CROSS/BLUE SHIELD | Admitting: Physician Assistant

## 2018-05-19 ENCOUNTER — Encounter: Payer: Self-pay | Admitting: Physician Assistant

## 2018-05-19 NOTE — Progress Notes (Signed)
   Virtual Visit via Video   I connected with Jonathan Spencer on 05/19/18 at  3:00 PM EDT by a video enabled telemedicine application and verified that I am speaking with the correct person using two identifiers. Location patient: Home Location provider: [facility], Office Persons participating in the virtual visit: Melinda, Cuny Parchment, LPN, Jarold Motto, [place].  I discussed the limitations of evaluation and management by telemedicine and the availability of in person appointments. The patient expressed understanding and agreed to proceed.  Subjective:   HPI:  Hypertension  Hyperlipidemia  ROS: See pertinent positives and negatives per HPI.  Patient Active Problem List   Diagnosis Date Noted  . Severe obstructive sleep apnea 08/19/2017  . Chest pain   . Abdominal pain, unspecified site   . Palpitation     Social History   Tobacco Use  . Smoking status: Former Games developer  . Smokeless tobacco: Current User    Types: Snuff  . Tobacco comment: very little, less than a pack per week, socially  Substance Use Topics  . Alcohol use: Yes    Alcohol/week: 0.0 standard drinks    Comment: 10-15 per week    Current Outpatient Medications:  Marland Kitchen  Multiple Vitamins-Minerals (MULTI-VITAMIN GUMMIES PO), Take 2 each by mouth daily., Disp: , Rfl:  .  omeprazole (PRILOSEC OTC) 20 MG tablet, Take 1 tablet (20 mg total) by mouth daily., Disp: 90 tablet, Rfl: 0 .  propranolol (INDERAL) 10 MG tablet, Take 1 tablet (10 mg total) by mouth 2 (two) times daily as needed., Disp: 60 tablet, Rfl: 6 .  atorvastatin (LIPITOR) 40 MG tablet, Take 1 tablet (40 mg total) by mouth daily., Disp: 30 tablet, Rfl: 3  No Known Allergies  Objective:   VITALS: Per patient if applicable, see vitals. GENERAL: Alert, appears well and in no acute distress. HEENT: Atraumatic, conjunctiva clear, no obvious abnormalities on inspection of external nose and ears. NECK: Normal movements of the head  and neck. CARDIOPULMONARY: No increased WOB. Speaking in clear sentences. I:E ratio WNL.  MS: Moves all visible extremities without noticeable abnormality. PSYCH: Pleasant and cooperative, well-groomed. Speech normal rate and rhythm. Affect is appropriate. Insight and judgement are appropriate. Attention is focused, linear, and appropriate.  NEURO: CN grossly intact. Oriented as arrived to appointment on time with no prompting. Moves both UE equally.  SKIN: No obvious lesions, wounds, erythema, or cyanosis noted on face or hands.  Assessment and Plan:   There are no diagnoses linked to this encounter.  . Reviewed expectations re: course of current medical issues. . Discussed self-management of symptoms. . Outlined signs and symptoms indicating need for more acute intervention. . Patient verbalized understanding and all questions were answered. Marland Kitchen Health Maintenance issues including appropriate healthy diet, exercise, and smoking avoidance were discussed with patient. . See orders for this visit as documented in the electronic medical record.  Corky Mull, LPN 05/02/1914

## 2018-05-20 ENCOUNTER — Ambulatory Visit (INDEPENDENT_AMBULATORY_CARE_PROVIDER_SITE_OTHER): Payer: BLUE CROSS/BLUE SHIELD | Admitting: Physician Assistant

## 2018-05-20 ENCOUNTER — Encounter: Payer: Self-pay | Admitting: Physician Assistant

## 2018-05-20 DIAGNOSIS — R7989 Other specified abnormal findings of blood chemistry: Secondary | ICD-10-CM

## 2018-05-20 DIAGNOSIS — R945 Abnormal results of liver function studies: Secondary | ICD-10-CM

## 2018-05-20 DIAGNOSIS — E785 Hyperlipidemia, unspecified: Secondary | ICD-10-CM | POA: Diagnosis not present

## 2018-05-20 NOTE — Progress Notes (Signed)
Virtual Visit via Video   I connected with Jonathan Spencer on 05/20/18 at  9:20 AM EDT by a video enabled telemedicine application and verified that I am speaking with the correct person using two identifiers. Location patient: Home Location provider: [facility], Office Persons participating in the virtual visit: Brando, Licari [place], [place]   I discussed the limitations of evaluation and management by telemedicine and the availability of in person appointments. The patient expressed understanding and agreed to proceed.  Subjective:   HPI:   Hyperlipidemia/Elevated LFTs Was last on Lipitor 40 mg back in October, ran out at that time and hasn't been seen by me since that time. He does have history of elevated LFTs and heavy drinking. He is drinking 1-3 drinks daily. He would like to resume his cholesterol medication.  Wt Readings from Last 3 Encounters:  07/13/17 242 lb 6.4 oz (110 kg)  07/09/17 241 lb (109.3 kg)  06/04/17 241 lb 3.2 oz (109.4 kg)     ROS: See pertinent positives and negatives per HPI.  Patient Active Problem List   Diagnosis Date Noted  . Severe obstructive sleep apnea 08/19/2017  . Chest pain   . Abdominal pain, unspecified site   . Palpitation     Social History   Tobacco Use  . Smoking status: Former Games developer  . Smokeless tobacco: Current User    Types: Snuff  . Tobacco comment: very little, less than a pack per week, socially  Substance Use Topics  . Alcohol use: Yes    Alcohol/week: 0.0 standard drinks    Comment: 10-15 per week    Current Outpatient Medications:  .  atorvastatin (LIPITOR) 40 MG tablet, Take 1 tablet (40 mg total) by mouth daily., Disp: 30 tablet, Rfl: 3 .  Multiple Vitamins-Minerals (MULTI-VITAMIN GUMMIES PO), Take 2 each by mouth daily., Disp: , Rfl:  .  omeprazole (PRILOSEC OTC) 20 MG tablet, Take 1 tablet (20 mg total) by mouth daily., Disp: 90 tablet, Rfl: 0 .  propranolol (INDERAL) 10 MG tablet, Take  1 tablet (10 mg total) by mouth 2 (two) times daily as needed., Disp: 60 tablet, Rfl: 6  No Known Allergies  Objective:   VITALS: Per patient if applicable, see vitals. GENERAL: Alert, appears well and in no acute distress. HEENT: Atraumatic, conjunctiva clear, no obvious abnormalities on inspection of external nose and ears. NECK: Normal movements of the head and neck. CARDIOPULMONARY: No increased WOB. Speaking in clear sentences. I:E ratio WNL.  MS: Moves all visible extremities without noticeable abnormality. PSYCH: Pleasant and cooperative, well-groomed. Speech normal rate and rhythm. Affect is appropriate. Insight and judgement are appropriate. Attention is focused, linear, and appropriate.  NEURO: CN grossly intact. Oriented as arrived to appointment on time with no prompting. Moves both UE equally.  SKIN: No obvious lesions, wounds, erythema, or cyanosis noted on face or hands.  Assessment and Plan:   Diagnoses and all orders for this visit:  Hyperlipidemia, unspecified hyperlipidemia type -     Lipid panel; Future -     Comprehensive metabolic panel; Future  Elevated LFTs   Due to history of elevated LFTs, with continued alcohol use, will go ahead and schedule labs to re-assess Lipids and LFTs prior to restarting cholesterol medication. Patient verbalized understanding to plan.  . Reviewed expectations re: course of current medical issues. . Discussed self-management of symptoms. . Outlined signs and symptoms indicating need for more acute intervention. . Patient verbalized understanding and all questions were  answered. Marland Kitchen. Health Maintenance issues including appropriate healthy diet, exercise, and smoking avoidance were discussed with patient. . See orders for this visit as documented in the electronic medical record.   I discussed the assessment and treatment plan with the patient. The patient was provided an opportunity to ask questions and all were answered. The  patient agreed with the plan and demonstrated an understanding of the instructions.   The patient was advised to call back or seek an in-person evaluation if the symptoms worsen or if the condition fails to improve as anticipated.     Jonathan Spencer, [place] 05/20/2018

## 2018-05-27 ENCOUNTER — Telehealth: Payer: Self-pay | Admitting: *Deleted

## 2018-05-27 DIAGNOSIS — G4733 Obstructive sleep apnea (adult) (pediatric): Secondary | ICD-10-CM | POA: Diagnosis not present

## 2018-05-27 NOTE — Telephone Encounter (Signed)
Spoke to pt told him calling to remind him of his lab appt tomorrow at 8:15. Pt verbalized understanding and said he will be there.

## 2018-05-28 ENCOUNTER — Other Ambulatory Visit (INDEPENDENT_AMBULATORY_CARE_PROVIDER_SITE_OTHER): Payer: BLUE CROSS/BLUE SHIELD

## 2018-05-28 ENCOUNTER — Other Ambulatory Visit: Payer: Self-pay

## 2018-05-28 DIAGNOSIS — E785 Hyperlipidemia, unspecified: Secondary | ICD-10-CM

## 2018-05-28 LAB — LIPID PANEL
Cholesterol: 209 mg/dL — ABNORMAL HIGH (ref 0–200)
HDL: 40.6 mg/dL (ref >39.00)
LDL Cholesterol: 138 mg/dL — ABNORMAL HIGH (ref 0–99)
NonHDL: 168.37
Total CHOL/HDL Ratio: 5
Triglycerides: 154 mg/dL — ABNORMAL HIGH (ref 0.0–149.0)
VLDL: 30.8 mg/dL (ref 0.0–40.0)

## 2018-05-28 LAB — COMPREHENSIVE METABOLIC PANEL
ALT: 45 U/L (ref 0–53)
AST: 23 U/L (ref 0–37)
Albumin: 4.4 g/dL (ref 3.5–5.2)
Alkaline Phosphatase: 70 U/L (ref 39–117)
BUN: 17 mg/dL (ref 6–23)
CO2: 30 mEq/L (ref 19–32)
Calcium: 8.6 mg/dL (ref 8.4–10.5)
Chloride: 103 mEq/L (ref 96–112)
Creatinine, Ser: 0.85 mg/dL (ref 0.40–1.50)
GFR: 106.02 mL/min (ref >60.00)
Glucose, Bld: 96 mg/dL (ref 70–99)
Potassium: 3.9 mEq/L (ref 3.5–5.1)
Sodium: 139 mEq/L (ref 135–145)
Total Bilirubin: 0.5 mg/dL (ref 0.2–1.2)
Total Protein: 6.6 g/dL (ref 6.0–8.3)

## 2018-05-31 ENCOUNTER — Other Ambulatory Visit: Payer: Self-pay | Admitting: Physician Assistant

## 2018-05-31 MED ORDER — ATORVASTATIN CALCIUM 20 MG PO TABS: 20.0000 mg | ORAL_TABLET | Freq: Every day | ORAL | 0 refills | Status: DC

## 2018-06-22 DIAGNOSIS — G4733 Obstructive sleep apnea (adult) (pediatric): Secondary | ICD-10-CM | POA: Diagnosis not present

## 2018-08-31 ENCOUNTER — Ambulatory Visit (INDEPENDENT_AMBULATORY_CARE_PROVIDER_SITE_OTHER): Payer: BLUE CROSS/BLUE SHIELD | Admitting: Physician Assistant

## 2018-08-31 ENCOUNTER — Encounter: Payer: Self-pay | Admitting: Physician Assistant

## 2018-08-31 DIAGNOSIS — E785 Hyperlipidemia, unspecified: Secondary | ICD-10-CM

## 2018-08-31 MED ORDER — ATORVASTATIN CALCIUM 20 MG PO TABS: 20.0000 mg | ORAL_TABLET | Freq: Every day | ORAL | 0 refills | Status: DC

## 2018-08-31 NOTE — Progress Notes (Signed)
Virtual Visit via Video   I connected with Jonathan Spencer on 08/31/18 at  3:40 PM EDT by a video enabled telemedicine application and verified that I am speaking with the correct person using two identifiers. Location patient: Home Location provider: [facility], Office Persons participating in the virtual visit: Jonathan Spencer, Jonathan Spencer [employer].  I discussed the limitations of evaluation and management by telemedicine and the availability of in person appointments. The patient expressed understanding and agreed to proceed.  I acted as a Education administrator for [employer], PA-C [employer], LPN  Subjective:   HPI:  Hyperlipidemia/Elevated LFTs Pt following up today last seen 3 months ago and was restarted back on cholesterol medication Lipitor 20 mg daily. Pt is taking medication 3 x's a week. Denies muscle or leg cramps. LFTs normal on 05/28/18. Pt is down to less than 2 drinks a day on average. Does need refill on Lipitor.  Lipid Panel     Component Value Date/Time   CHOL 209 (H) 05/28/2018 0814   CHOL 248 (H) 07/13/2017 0844   TRIG 154.0 (H) 05/28/2018 0814   HDL 40.60 05/28/2018 0814   HDL 49 07/13/2017 0844   CHOLHDL 5 05/28/2018 0814   VLDL 30.8 05/28/2018 0814   LDLCALC 138 (H) 05/28/2018 0814   LDLCALC 144 (H) 07/13/2017 0844      ROS: See pertinent positives and negatives per HPI.  Patient Active Problem List   Diagnosis Date Noted  . Severe obstructive sleep apnea 08/19/2017  . Chest pain   . Abdominal pain, unspecified site   . Palpitation     Social History   Tobacco Use  . Smoking status: Former Research scientist (life sciences)  . Smokeless tobacco: Current User    Types: Snuff  . Tobacco comment: very little, less than a pack per week, socially  Substance Use Topics  . Alcohol use: Yes    Alcohol/week: 0.0 standard drinks    Comment: 10-15 per week    Current Outpatient Medications:  .  atorvastatin (LIPITOR) 20 MG tablet, Take 1 tablet (20 mg total) by  mouth daily., Disp: 90 tablet, Rfl: 0 .  Multiple Vitamins-Minerals (MULTI-VITAMIN GUMMIES PO), Take 2 each by mouth daily., Disp: , Rfl:  .  omeprazole (PRILOSEC OTC) 20 MG tablet, Take 1 tablet (20 mg total) by mouth daily., Disp: 90 tablet, Rfl: 0 .  propranolol (INDERAL) 10 MG tablet, Take 1 tablet (10 mg total) by mouth 2 (two) times daily as needed. (Patient not taking: Reported on 08/31/2018), Disp: 60 tablet, Rfl: 6  No Known Allergies  Objective:   VITALS: Per patient if applicable, see vitals. GENERAL: Alert, appears well and in no acute distress. HEENT: Atraumatic, conjunctiva clear, no obvious abnormalities on inspection of external nose and ears. NECK: Normal movements of the head and neck. CARDIOPULMONARY: No increased WOB. Speaking in clear sentences. I:E ratio WNL.  MS: Moves all visible extremities without noticeable abnormality. PSYCH: Pleasant and cooperative, well-groomed. Speech normal rate and rhythm. Affect is appropriate. Insight and judgement are appropriate. Attention is focused, linear, and appropriate.  NEURO: CN grossly intact. Oriented as arrived to appointment on time with no prompting. Moves both UE equally.  SKIN: No obvious lesions, wounds, erythema, or cyanosis noted on face or hands.  Assessment and Plan:   Jonathan Spencer was seen today for hyperlipidemia.  Diagnoses and all orders for this visit:  Hyperlipidemia, unspecified hyperlipidemia type  Other orders -     atorvastatin (LIPITOR) 20 MG tablet; Take 1  tablet (20 mg total) by mouth daily.   Patient declines checking cholesterol today. Will refill meds x 3 months and have him return for further follow-up at that time, sooner if issues.  . Reviewed expectations re: course of current medical issues. . Discussed self-management of symptoms. . Outlined signs and symptoms indicating need for more acute intervention. . Patient verbalized understanding and all questions were answered. Marland Kitchen. Health  Maintenance issues including appropriate healthy diet, exercise, and smoking avoidance were discussed with patient. . See orders for this visit as documented in the electronic medical record.  I discussed the assessment and treatment plan with the patient. The patient was provided an opportunity to ask questions and all were answered. The patient agreed with the plan and demonstrated an understanding of the instructions.   The patient was advised to call back or seek an in-person evaluation if the symptoms worsen or if the condition fails to improve as anticipated.   CMA or LPN served as scribe during this visit. History, Physical, and Plan performed by medical provider. The above documentation has been reviewed and is accurate and complete.   Live OakSamantha Ira Spencer, [place] 08/31/2018

## 2019-02-10 ENCOUNTER — Ambulatory Visit (INDEPENDENT_AMBULATORY_CARE_PROVIDER_SITE_OTHER): Payer: Self-pay | Admitting: Physician Assistant

## 2019-02-10 ENCOUNTER — Encounter: Payer: Self-pay | Admitting: Physician Assistant

## 2019-02-10 ENCOUNTER — Ambulatory Visit: Payer: BC Managed Care – PPO | Attending: Internal Medicine

## 2019-02-10 VITALS — Temp 98.0°F | Ht 75.0 in | Wt 210.0 lb

## 2019-02-10 DIAGNOSIS — J029 Acute pharyngitis, unspecified: Secondary | ICD-10-CM

## 2019-02-10 DIAGNOSIS — Z20822 Contact with and (suspected) exposure to covid-19: Secondary | ICD-10-CM

## 2019-02-10 MED ORDER — AMOXICILLIN 500 MG PO CAPS: 500.0000 mg | ORAL_CAPSULE | Freq: Two times a day (BID) | ORAL | 0 refills | Status: AC

## 2019-02-10 NOTE — Progress Notes (Signed)
TELEPHONE ENCOUNTER   Patient verbally agreed to telephone visit and is aware that copayment and coinsurance may apply. Patient was treated using telemedicine according to accepted telemedicine protocols.  Location of the patient: home Location of provider: [facility] [employer] of all persons participating in the telemedicine service and role in the encounter: Jarold Motto, [place], [place]  Subjective:   Chief Complaint  Patient presents with  . Covid symptoms     HPI   Patient is requesting evaluation for sore throat.  Symptom onset: 5-6 days ago  Travel/contacts: No travel and no exposure that he is aware of.  Patient endorses the following symptoms: sore throat and difficulty swallowing; possible ear pain; 9.5/10 pain in the AM  Has redness in the back of his throat; doesn't have white patches on tonsils. Worse in the mornings, likely from CPAP.  Patient denies the following symptoms: Fever (none), sinus headache, sinus congestion and rhinorrhea; cough; loss of taste/smell  Treatments tried Cough drops and gargle with salt water  Patient risk factors: Current COVID-19 risk of complications score: 2 Smoking status: Jimie Kuwahara  reports that he has quit smoking. His smokeless tobacco use includes snuff. If male, currently pregnant? []   Yes [x]   No  Patient Active Problem List   Diagnosis Date Noted  . Severe obstructive sleep apnea 08/19/2017  . Chest pain   . Abdominal pain, unspecified site   . Palpitation    Social History   Tobacco Use  . Smoking status: Former  . Smokeless tobacco: Current User    Types: Snuff  . Tobacco comment: very little, less than a pack per week, socially  Substance Use Topics  . Alcohol use: Yes    Alcohol/week: 0.0 standard drinks    Comment: 10-15 per week    Current Outpatient Medications:  .  atorvastatin (LIPITOR) 20 MG tablet, Take 1 tablet (20 mg total) by mouth daily., Disp: 90 tablet,  Rfl: 0 .  Multiple Vitamins-Minerals (MULTI-VITAMIN GUMMIES PO), Take 2 each by mouth daily., Disp: , Rfl:  .  omeprazole (PRILOSEC OTC) 20 MG tablet, Take 1 tablet (20 mg total) by mouth daily., Disp: 90 tablet, Rfl: 0 .  amoxicillin (AMOXIL) 500 MG capsule, Take 1 capsule (500 mg total) by mouth 2 (two) times daily for 10 days., Disp: 20 capsule, Rfl: 0 No Known Allergies  Assessment & Plan:   1. Sore throat    Patient has a respiratory illness without signs of acute distress or respiratory compromise at this time.   We are going to empirically treat for strep with amoxicillin per orders. Recommend taking oral ibuprofen for pain/inflammation.  We are going to send patient for drive-up testing. As a precaution, they have been advised to remain home until COVID-19 results and then possible further quarantine after that based on results and symptoms. Advised if they experience a "second sickening" or worsening symptoms as the illness progresses, they are to call the office for further instructions or seek emergent evaluation for any severe symptoms.    Orders Placed This Encounter  Procedures  . Novel Coronavirus, NAA ([facility])   Meds ordered this encounter  Medications  . amoxicillin (AMOXIL) 500 MG capsule    Sig: Take 1 capsule (500 mg total) by mouth 2 (two) times daily for 10 days.    Dispense:  20 capsule    Refill:  0    Order Specific Question:   Supervising Provider    Answer:  S[place], [place], PA 02/10/2019  Time spent with the patient: 8 minutes, spent in obtaining information about his symptoms, reviewing his previous labs, evaluations, and treatments, counseling him about his condition (please see the discussed topics above), and developing a plan to further investigate it; he had a number of questions which I addressed.

## 2019-02-12 LAB — NOVEL CORONAVIRUS, NAA: SARS-CoV-2, NAA: NOT DETECTED

## 2020-06-29 DIAGNOSIS — J0100 Acute maxillary sinusitis, unspecified: Secondary | ICD-10-CM | POA: Diagnosis not present

## 2021-10-28 ENCOUNTER — Encounter: Payer: Self-pay | Admitting: *Deleted

## 2021-12-02 ENCOUNTER — Encounter: Payer: Self-pay | Admitting: Physician Assistant

## 2021-12-02 ENCOUNTER — Ambulatory Visit (INDEPENDENT_AMBULATORY_CARE_PROVIDER_SITE_OTHER): Payer: Commercial Managed Care - PPO | Admitting: Physician Assistant

## 2021-12-02 ENCOUNTER — Other Ambulatory Visit (HOSPITAL_COMMUNITY)
Admission: RE | Admit: 2021-12-02 | Discharge: 2021-12-02 | Disposition: A | Discharge status: Home / Self Care | Payer: Commercial Managed Care - PPO | Source: Ambulatory Visit | Attending: Physician Assistant | Admitting: Physician Assistant

## 2021-12-02 VITALS — BP 130/80 | HR 70 | Temp 97.8°F | Ht 75.0 in | Wt 245.4 lb

## 2021-12-02 DIAGNOSIS — Z23 Encounter for immunization: Secondary | ICD-10-CM | POA: Diagnosis not present

## 2021-12-02 DIAGNOSIS — Z202 Contact with and (suspected) exposure to infections with a predominantly sexual mode of transmission: Secondary | ICD-10-CM | POA: Insufficient documentation

## 2021-12-02 DIAGNOSIS — F4323 Adjustment disorder with mixed anxiety and depressed mood: Secondary | ICD-10-CM

## 2021-12-02 MED ORDER — BUSPIRONE HCL 10 MG PO TABS: 10.0000 mg | ORAL_TABLET | Freq: Two times a day (BID) | ORAL | 0 refills | Status: DC

## 2021-12-02 NOTE — Patient Instructions (Addendum)
It was great to see you!  If you develop suicidal thoughts, please tell someone and immediately proceed to our local 24/7 crisis center, [facility] at the [facility]. [address], [place], [place] [address] [phone].  Follow-up in 1 month for physical and follow-up on mental health.  Take care,  Inda Coke PA-C
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
## 2021-12-03 LAB — URINE CYTOLOGY ANCILLARY ONLY
Chlamydia: NEGATIVE
Comment: NEGATIVE
Comment: NEGATIVE
Comment: NORMAL
Neisseria Gonorrhea: NEGATIVE
Trichomonas: NEGATIVE

## 2021-12-03 LAB — HIV ANTIBODY (ROUTINE TESTING W REFLEX): HIV 1&2 Ab, 4th Generation: NONREACTIVE

## 2021-12-03 LAB — RPR: RPR Ser Ql: NONREACTIVE

## 2021-12-03 LAB — HEPATITIS C ANTIBODY: Hepatitis C Ab: NONREACTIVE

## 2021-12-04 ENCOUNTER — Encounter: Payer: Self-pay | Admitting: Psychology

## 2021-12-04 ENCOUNTER — Ambulatory Visit (INDEPENDENT_AMBULATORY_CARE_PROVIDER_SITE_OTHER): Payer: Commercial Managed Care - PPO | Admitting: Psychology

## 2021-12-04 DIAGNOSIS — F430 Acute stress reaction: Secondary | ICD-10-CM
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
## 2021-12-04 NOTE — Progress Notes (Signed)
? ? ? ? ? ? ? ? ? ? ? ? ? ? ?  Jonathan Spencer, LCMHC ?

## 2021-12-09 ENCOUNTER — Encounter: Payer: BC Managed Care – PPO | Admitting: Physician Assistant

## 2021-12-10 ENCOUNTER — Ambulatory Visit (INDEPENDENT_AMBULATORY_CARE_PROVIDER_SITE_OTHER): Payer: Commercial Managed Care - PPO | Admitting: Psychology

## 2021-12-10 DIAGNOSIS — F430 Acute stress reaction: Secondary | ICD-10-CM

## 2021-12-10 NOTE — Progress Notes (Unsigned)
? ? ? ? ? ? ? ? ? ? ? ? ? ? ?  Sherill Mangen, LCMHC ?

## 2021-12-10 NOTE — Progress Notes (Unsigned)
[facility] Counselor/Therapist Progress Note  Patient ID: Jonathan Spencer, MRN: [id number],    Date: 12/10/2021  Time Spent: 9:02am- 9:59am  pt is seen for a virtual video visit via caregility. Pt joins from his home, reporting privacy, and counselor from her home office.   Treatment Type: Individual Therapy  Reported Symptoms: some feeling on edge, some depressed moods, decreased intrusive thoughts  Mental Status Exam: Appearance:  Well Groomed     Behavior: Appropriate  Motor: Normal  Speech/Language:  Normal Rate  Affect: Appropriate  Mood: dysthymic  Thought process: normal  Thought content:   WNL  Sensory/Perceptual disturbances:   WNL  Orientation: oriented to person, place, time/date, and situation  Attention: Good  Concentration: Good  Memory: WNL  Fund of knowledge:  Good  Insight:   Good  Judgment:  Good  Impulse Control: Good   Risk Assessment: Danger to Self:  No Self-injurious Behavior: No Danger to Others: No Duty to Warn:no Physical Aggression / Violence:No  Access to Firearms a concern: No  Gang Involvement:No   Subjective: counselor assessed pt current functioning per pt report. Discussed tx goals and developed tx plan together.  Informed of therapeutic process. Processed w/ pt decreased enjoyment and balancing parenting, relationship and self.  Discussed steps for self care and being intentional about.  Pt affect wnl.  Pt reported fatigued this week and contributes some increase to lack of sleep w/ kids sick.  Pt reported on 1/2 through course of HIV prevention meds.  Pt reports has been busy over past week and reports decreased intrusive thoughts of trauma.  Pt reports want to increased enjoyment and overall happiness.  Pt reports wants for increased time to foster relationship w/ wife and would like for time for self.  Pt reports plans for increased movement/working out. Pt disucssed goals for tx.     Interventions: Cognitive  Behavioral Therapy and self care education  Diagnosis:Acute stress disorder R/o depression and alcohol use d/o  Plan: Pt to f/u w/ biweekly counseling.  Pt to f/u as scheduled w/ PCP.    Individualized Treatment Plan Strengths: "I have more fully embraced being a helpful partner.  Working from home has been positive" focus on taking care of self.  Aware of unhealthy relationship w/ alcohol  Supports: wife.   Goal/Needs for Treatment:  In order of importance to patient 1) increase happiness and enjoyment in things 2) foster relationship w/ wife 3) be healthier   Client Statement of Needs: "I want to be happy and fulfilled.  Be able to enjoy things for self and in relationship.  I want to be healthier"   Treatment Level:outpatient counseling  Symptoms:anxiety and intrusive thoughts, depressed moods, difficulty sleeping, loss of interest  Client Treatment Preferences:biweekly to monthly counseling   Healthcare consumer's goal for treatment:  Counselor, Jonathan Spencer, [facility] will support the patient's ability to achieve the goals identified. Cognitive Behavioral Therapy, Assertive Communication/Conflict Resolution Training, Relaxation Training, ACT, Humanistic and other evidenced-based practices will be used to promote progress towards healthy functioning.   Healthcare consumer will: Actively participate in therapy, working towards healthy functioning.    *Justification for Continuation/Discontinuation of Goal: R=Revised, O=Ongoing, A=Achieved, D=Discontinued  Goal 1) increased effective coping skills and self care to manage anxiety and depression AEB pt report. Baseline date 12/10/21: Progress towards goal 0; How Often - Daily Target Date Goal Was reviewed Status Code Progress towards goal/Likert rating  12/10/21  Goal 2) Increase positive interactions w/ wife AEB increased time engaging as a couple. Baseline date 12/10/21: Progress towards goal 0; How Often -  Daily Target Date Goal Was reviewed Status Code Progress towards goal  12/11/22                Goal 3) Pt to increase awareness of health, make decisions consistent w/ increased health AEB pt report.  Baseline date 12/10/21: Progress towards goal 0; How Often - Daily Target Date Goal Was reviewed Status Code Progress towards goal  12/11/22                This plan has been reviewed and created by the following participants:  This plan will be reviewed at least every 12 months. Date Behavioral Health Clinician Date Guardian/Patient   12/10/21   12/10/21 Verbal Consent Provided                    Jonathan Spencer, [facility]

## 2021-12-24 ENCOUNTER — Other Ambulatory Visit: Payer: Self-pay | Admitting: Physician Assistant

## 2022-01-01 ENCOUNTER — Encounter: Payer: Self-pay | Admitting: Physician Assistant

## 2022-01-01 ENCOUNTER — Ambulatory Visit (INDEPENDENT_AMBULATORY_CARE_PROVIDER_SITE_OTHER): Payer: Commercial Managed Care - PPO | Admitting: Physician Assistant

## 2022-01-01 VITALS — BP 110/70 | HR 75 | Temp 97.3°F | Ht 75.0 in | Wt 247.2 lb

## 2022-01-01 DIAGNOSIS — Z0000 Encounter for general adult medical examination without abnormal findings: Secondary | ICD-10-CM | POA: Diagnosis not present

## 2022-01-01 DIAGNOSIS — Z206 Contact with and (suspected) exposure to human immunodeficiency virus [HIV]: Secondary | ICD-10-CM

## 2022-01-01 DIAGNOSIS — G4733 Obstructive sleep apnea (adult) (pediatric): Secondary | ICD-10-CM

## 2022-01-01 DIAGNOSIS — E785 Hyperlipidemia, unspecified: Secondary | ICD-10-CM

## 2022-01-01 DIAGNOSIS — E669 Obesity, unspecified: Secondary | ICD-10-CM

## 2022-01-01 DIAGNOSIS — F4323 Adjustment disorder with mixed anxiety and depressed mood: Secondary | ICD-10-CM | POA: Diagnosis not present

## 2022-01-01 NOTE — Progress Notes (Signed)
Subjective:    Jonathan Spencer is a 31 y.o. male and is here for a comprehensive physical exam.  HPI  There are no preventive care reminders to display for this patient.  Acute Concerns: No acute issues discussed.  Chronic Issues: Situational Mixed Anxiety and Depressive Disorder Patient reports that he has seen a counselor for mood and is not sure if he is getting much out of this just yet. He reports that he occasionally takes his 10 mg Buspar, but does not feel like it is helping much. Patient states that he continues to feel manic-like and irritable at home. He keeps busy washing dishes and doing activities to not be alone with thoughts. Patient is willing to be referred to psychiatry for this issue.    HIV exposure See my most recent note. He has completed his 4-weeks of HIV post-exposure prophylaxis.  Health Maintenance: PSA --  Lab Results  Component Value Date   PSA 0.38 05/11/2014   Diet -- Patient reports that he is making healthier food choices, but still consumes some unhealthy treats and foods. Sleep habits -- Patient reports that he regularly wears his CPAP at night. Exercise -- He reports that he does not exercise.  Weight --  Recent weight history Wt Readings from Last 10 Encounters:  01/01/22 247 lb 4 oz (112.2 kg)  12/02/21 245 lb 6.1 oz (111.3 kg)  02/10/19 210 lb (95.3 kg)  07/13/17 242 lb 6.4 oz (110 kg)  07/09/17 241 lb (109.3 kg)  06/04/17 241 lb 3.2 oz (109.4 kg)  05/11/17 239 lb 8 oz (108.6 kg)  10/07/16 221 lb (100.2 kg)  09/23/16 222 lb 6.4 oz (100.9 kg)  05/14/14 185 lb (83.9 kg)   Body mass index is 30.9 kg/m.  Alcohol use --  reports current alcohol use.  Tobacco use --  Tobacco Use: High Risk (01/01/2022)   Patient History    Smoking Tobacco Use: Former    Smokeless Tobacco Use: Current    Passive Exposure: Not on file    Eligible for Low Dose CT? no     01/01/2022    9:50 AM  Depression screen PHQ 2/9  Decreased  Interest 1  Down, Depressed, Hopeless 2  PHQ - 2 Score 3  Altered sleeping 1  Tired, decreased energy 3  Change in appetite 2  Feeling bad or failure about yourself  2  Trouble concentrating 2  Moving slowly or fidgety/restless 0  Suicidal thoughts 0  PHQ-9 Score 13  Difficult doing work/chores Somewhat difficult    Other providers/specialists: Patient Care Team: Jarold Motto, [place] as PCP - General (Physician Assistant) Huston Foley, [place] as Attending Physician (Neurology)    PMHx, SurgHx, SocialHx, Medications, and Allergies were reviewed in the Visit Navigator and updated as appropriate.   Past Medical History:  Diagnosis Date   Abdominal pain, unspecified site    Acid reflux    Chest pain    Palpitation     History reviewed. No pertinent surgical history.   Family History  Problem Relation Age of Onset   Depression Mother    Heart disease Mother 24       scad   Parkinson's disease Mother 56   Heart disease Maternal Grandmother    Hearing loss Maternal Grandmother    Cancer Maternal Grandfather        prostate and bone   Alcohol abuse Paternal Grandmother    COPD Paternal Grandmother    Alcohol abuse Paternal Actor  Cancer Paternal Grandfather        lung   Drug abuse Paternal Grandfather     Social History   Tobacco Use   Smoking status: Former   Smokeless tobacco: Current    Types: Snuff   Tobacco comments:    very little, less than a pack per week, socially  Substance Use Topics   Alcohol use: Yes    Comment: 3-4 per week   Drug use: No    Review of Systems:   Review of Systems  Constitutional:  Negative for chills, fever, malaise/fatigue and weight loss.  HENT:  Negative for hearing loss, sinus pain and sore throat.   Respiratory:  Negative for cough and hemoptysis.   Cardiovascular:  Negative for chest pain, palpitations, leg swelling and PND.  Gastrointestinal:  Negative for abdominal pain, constipation, diarrhea, heartburn,  nausea and vomiting.  Genitourinary:  Negative for dysuria, frequency and urgency.  Musculoskeletal:  Negative for back pain, myalgias and neck pain.  Skin:  Negative for itching and rash.  Endo/Heme/Allergies:  Negative for polydipsia.  Psychiatric/Behavioral:  Negative for depression. The patient is nervous/anxious.     Objective:    Vitals:   01/01/22 0944  BP: 110/70  Pulse: 75  Temp: (!) 97.3 F (36.3 C)  SpO2: 97%    Body mass index is 30.9 kg/m.  General  Alert, cooperative, no distress, appears stated age  Head:  Normocephalic, without obvious abnormality, atraumatic  Eyes:  PERRL, conjunctiva/corneas clear, EOM's intact, fundi benign, both eyes       Ears:  Normal TM's and external ear canals, both ears  Nose: Nares normal, septum midline, mucosa normal, no drainage or sinus tenderness  Throat: Lips, mucosa, and tongue normal; teeth and gums normal  Neck: Supple, symmetrical, trachea midline, no adenopathy;     thyroid:  No enlargement/tenderness/nodules; no carotid bruit or JVD  Back:   Symmetric, no curvature, ROM normal, no CVA tenderness  Lungs:   Clear to auscultation bilaterally, respirations unlabored  Chest wall:  No tenderness or deformity  Heart:  Regular rate and rhythm, S1 and S2 normal, no murmur, rub or gallop  Abdomen:   Soft, non-tender, bowel sounds active all four quadrants, no masses, no organomegaly  Extremities: Extremities normal, atraumatic, no cyanosis or edema  Prostate : Deferred   Skin: Skin color, texture, turgor normal, no rashes or lesions  Lymph nodes: Cervical, supraclavicular, and axillary nodes normal  Neurologic: CNII-XII grossly intact. Normal strength, sensation and reflexes throughout   AssessmentPlan:   Routine physical examination Today patient counseled on age appropriate routine health concerns for screening and prevention, each reviewed and up to date or declined. Immunizations reviewed and up to date or declined. Labs  ordered for future. Risk factors for depression reviewed and negative. Hearing function and visual acuity are intact. ADLs screened and addressed as needed. Functional ability and level of safety reviewed and appropriate. Education, counseling and referrals performed based on assessed risks today. Patient provided with a copy of personalized plan for preventive services.  Situational mixed anxiety and depressive disorder Uncontrolled Denies SI/HI Continue talk therapy if desired Referral to psychiatry I discussed with patient that if they develop any SI, to tell someone immediately and seek medical attention.  Hyperlipidemia, unspecified hyperlipidemia type Update lipid panel and add lipitor back if needed  Obesity, unspecified classification, unspecified obesity type, unspecified whether serious comorbidity present Continue efforts at healthy diet and exercise  Severe obstructive sleep apnea Compliant  HIV exposure Recheck  HIV at 6 week mark (next week) -- future lab orders placed   I,Verona Buck,acting as a scribe for [employer], PA.,have documented all relevant documentation on the behalf of Inda Coke, PA,as directed by  Inda Coke, PA while in the presence of Inda Coke, [place].  I, Inda Coke, [place], have reviewed all documentation for this visit. The documentation on 01/01/22 for the exam, diagnosis, procedures, and orders are all accurate and complete.   Inda Coke, PA-C [place]

## 2022-01-01 NOTE — Patient Instructions (Addendum)
It was great to see you!  I'm going to place a referral for [facility] for psychiatry Please go online and complete the intake forms if you do not hear from them in a few days  If you develop suicidal thoughts, please tell someone and immediately proceed to our local 24/7 crisis center, [facility]. [address] [place], [place] [address] [phone].  Please make an appointment with the lab on your way out. I would like for you to return for lab work within 1-2 weeks. After midnight on the day of the lab draw, please do not eat anything. You may have water, black coffee, unsweetened tea.  Take care,  Lelon Mast

## 2022-01-02 ENCOUNTER — Ambulatory Visit: Payer: Commercial Managed Care - PPO | Admitting: Psychology

## 2022-01-09 ENCOUNTER — Other Ambulatory Visit (INDEPENDENT_AMBULATORY_CARE_PROVIDER_SITE_OTHER): Payer: Commercial Managed Care - PPO

## 2022-01-09 DIAGNOSIS — E669 Obesity, unspecified: Secondary | ICD-10-CM | POA: Diagnosis not present

## 2022-01-09 DIAGNOSIS — E785 Hyperlipidemia, unspecified: Secondary | ICD-10-CM | POA: Diagnosis not present

## 2022-01-09 DIAGNOSIS — Z206 Contact with and (suspected) exposure to human immunodeficiency virus [HIV]: Secondary | ICD-10-CM

## 2022-01-09 LAB — CBC WITH DIFFERENTIAL/PLATELET
Basophils Absolute: 0.1 10*3/uL (ref 0.0–0.1)
Basophils Relative: 1 % (ref 0.0–3.0)
Eosinophils Absolute: 0.2 10*3/uL (ref 0.0–0.7)
Eosinophils Relative: 2.7 % (ref 0.0–5.0)
HCT: 47 % (ref 39.0–52.0)
Hemoglobin: 16 g/dL (ref 13.0–17.0)
Lymphocytes Relative: 26.9 % (ref 12.0–46.0)
Lymphs Abs: 2.3 10*3/uL (ref 0.7–4.0)
MCHC: 34.1 g/dL (ref 30.0–36.0)
MCV: 86 fl (ref 78.0–100.0)
Monocytes Absolute: 0.5 10*3/uL (ref 0.1–1.0)
Monocytes Relative: 5.7 % (ref 3.0–12.0)
Neutro Abs: 5.5 10*3/uL (ref 1.4–7.7)
Neutrophils Relative %: 63.7 % (ref 43.0–77.0)
Platelets: 212 10*3/uL (ref 150.0–400.0)
RBC: 5.46 Mil/uL (ref 4.22–5.81)
RDW: 13.6 % (ref 11.5–15.5)
WBC: 8.6 10*3/uL (ref 4.0–10.5)

## 2022-01-09 LAB — COMPREHENSIVE METABOLIC PANEL
ALT: 40 U/L (ref 0–53)
AST: 23 U/L (ref 0–37)
Albumin: 4.5 g/dL (ref 3.5–5.2)
Alkaline Phosphatase: 62 U/L (ref 39–117)
BUN: 18 mg/dL (ref 6–23)
CO2: 30 mEq/L (ref 19–32)
Calcium: 8.9 mg/dL (ref 8.4–10.5)
Chloride: 102 mEq/L (ref 96–112)
Creatinine, Ser: 0.87 mg/dL (ref 0.40–1.50)
GFR: 113.48 mL/min (ref >60.00)
Glucose, Bld: 87 mg/dL (ref 70–99)
Potassium: 4.3 mEq/L (ref 3.5–5.1)
Sodium: 138 mEq/L (ref 135–145)
Total Bilirubin: 0.6 mg/dL (ref 0.2–1.2)
Total Protein: 6.9 g/dL (ref 6.0–8.3)

## 2022-01-09 LAB — LIPID PANEL
Cholesterol: 197 mg/dL (ref 0–200)
HDL: 48.4 mg/dL (ref >39.00)
LDL Cholesterol: 121 mg/dL — ABNORMAL HIGH (ref 0–99)
NonHDL: 148.58
Total CHOL/HDL Ratio: 4
Triglycerides: 137 mg/dL (ref 0.0–149.0)
VLDL: 27.4 mg/dL (ref 0.0–40.0)

## 2022-01-09 LAB — HEMOGLOBIN A1C: Hgb A1c MFr Bld: 5.7 % (ref 4.6–6.5)

## 2022-01-10 LAB — HIV ANTIBODY (ROUTINE TESTING W REFLEX): HIV 1&2 Ab, 4th Generation: NONREACTIVE

## 2022-01-11 ENCOUNTER — Other Ambulatory Visit: Payer: Self-pay | Admitting: Physician Assistant

## 2022-01-11 MED ORDER — ATORVASTATIN CALCIUM 20 MG PO TABS: 20.0000 mg | ORAL_TABLET | Freq: Every day | ORAL | 0 refills | Status: DC

## 2022-09-09 ENCOUNTER — Ambulatory Visit: Payer: Commercial Managed Care - PPO | Admitting: Physician Assistant

## 2022-09-09 ENCOUNTER — Encounter: Payer: Self-pay | Admitting: Physician Assistant

## 2022-09-09 VITALS — BP 134/81 | HR 71 | Temp 97.8°F | Ht 75.0 in | Wt 253.8 lb

## 2022-09-09 DIAGNOSIS — H35711 Central serous chorioretinopathy, right eye: Secondary | ICD-10-CM | POA: Diagnosis not present

## 2022-09-09 DIAGNOSIS — F4323 Adjustment disorder with mixed anxiety and depressed mood: Secondary | ICD-10-CM

## 2022-09-09 LAB — BASIC METABOLIC PANEL
BUN: 14 mg/dL (ref 6–23)
CO2: 27 mEq/L (ref 19–32)
Calcium: 9.5 mg/dL (ref 8.4–10.5)
Chloride: 102 mEq/L (ref 96–112)
Creatinine, Ser: 0.81 mg/dL (ref 0.40–1.50)
GFR: 115.42 mL/min (ref >60.00)
Glucose, Bld: 97 mg/dL (ref 70–99)
Potassium: 3.8 mEq/L (ref 3.5–5.1)
Sodium: 138 mEq/L (ref 135–145)

## 2022-09-09 NOTE — Progress Notes (Signed)
Jonathan Spencer is a 32 y.o. male here for a follow up of a pre-existing problem.  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  He states that this condition is most likely due to stress. He is currently using eye drops twice daily.  He states that he has to have some blood work done as the medication for his condition will effect his potassium and electrolytes levels (Eplerenone).   Stress: He has been experiencing increased stress recently. He states that he occasionally takes Buspar 10 mg but dislikes it's side effects thus he doesn't take it as often.       Past Medical History:  Diagnosis Date   Abdominal pain, unspecified site    Acid reflux    Chest pain    Palpitation      Social History   Tobacco Use   Smoking status: Former   Smokeless tobacco: Current    Types: Snuff   Tobacco comments:    very little, less than a pack per week, socially  Substance Use Topics   Alcohol use: Yes    Comment: 3-4 per week   Drug use: No    History reviewed. No pertinent surgical history.  Family History  Problem Relation Age of Onset   Depression Mother    Heart disease Mother 17       scad   Parkinson's disease Mother 21   Heart disease Maternal Grandmother    Hearing loss Maternal Grandmother    Cancer Maternal Grandfather        prostate and bone   Alcohol abuse Paternal Grandmother    COPD Paternal Grandmother    Alcohol abuse Paternal Grandfather    Cancer Paternal Grandfather        lung   Drug abuse Paternal Grandfather     No Known Allergies  Current Medications:   Current Outpatient Medications:    busPIRone (BUSPAR) 10 MG tablet, TAKE 1 TABLET BY MOUTH TWICE A DAY (Patient taking differently: Take 10 mg by mouth as needed.), Disp: 180 tablet, Rfl: 1   Calcium  Carb-Cholecalciferol (CALCIUM 500 + D PO), Take 1 tablet by mouth daily in the afternoon., Disp: , Rfl:    dorzolamide (TRUSOPT) 2 % ophthalmic solution, Place 1 drop into the right eye 2 (two) times daily., Disp: , Rfl:    Magnesium 400 MG TABS, Take 1 tablet by mouth daily in the afternoon., Disp: , Rfl:    Omega-3 Fatty Acids (FISH OIL) 1000 MG CAPS, Take 2 capsules by mouth daily in the afternoon., Disp: , Rfl:    omeprazole (PRILOSEC OTC) 20 MG tablet, Take 1 tablet (20 mg total) by mouth daily., Disp: 90 tablet, Rfl: 0   Specialty Vitamins Products (BIOTIN PLUS KERATIN PO), Take 1 tablet by mouth daily in the afternoon., Disp: , Rfl:    Turmeric 500 MG CAPS, Take 1 capsule by mouth daily in the afternoon., Disp: , Rfl:    Review of Systems:   Review of Systems  Eyes:  Positive for pain.  Gastrointestinal:  Positive for heartburn.    Vitals:   Vitals:   09/09/22 1033  BP: 134/81  Pulse: 71  Temp: 97.8 F (36.6 C)  TempSrc: Temporal  SpO2: 97%  Weight: 253 lb 12.8 oz (115.1 kg)  Height: 6\' 3"  (1.905 m)     Body mass index is 31.72 kg/m.  Physical Exam:   Physical Exam Vitals and nursing note reviewed.  Constitutional:      Appearance: He is well-developed.  HENT:     Head: Normocephalic.  Eyes:     Conjunctiva/sclera: Conjunctivae normal.     Pupils: Pupils are equal, round, and reactive to light.  Pulmonary:     Effort: Pulmonary effort is normal.  Musculoskeletal:        General: Normal range of motion.     Cervical back: Normal range of motion.  Skin:    General: Skin is warm and dry.  Neurological:     Mental Status: He is alert and oriented to person, place, and time.  Psychiatric:        Behavior: Behavior normal.        Thought Content: Thought content normal.        Judgment: Judgment normal.     Assessment and Plan:   Situational mixed anxiety and depressive disorder Ongoing Denies any need for acute intervention Continue as needed buspar 10  mg twice daily Follow-up if new/worsening sx  Central serous chorioretinopathy of eye, right Update electrolytes  Planning to start eplerenone per optho Will plan to update BMP in 4-6 weeks as well after initiation    I,Safa M Kadhim,acting as a scribe for [employer], PA.,have documented all relevant documentation on the behalf of Jarold Motto, PA,as directed by  Jarold Motto, PA while in the presence of Jarold Motto, [place].   I, Jarold Motto, [place], have reviewed all documentation for this visit. The documentation on 09/09/22 for the exam, diagnosis, procedures, and orders are all accurate and complete.   Jarold Motto, PA-C

## 2022-09-09 NOTE — Patient Instructions (Signed)
It was great to see you!  Good luck with your eyes! Update blood work today and then 4-6 after starting medication.  Take care,  Jarold Motto PA-C

## 2022-10-06 HISTORY — PX: VASECTOMY: SHX75

## 2023-01-12 NOTE — Progress Notes (Signed)
Jonathan Spencer is a 32 y.o. male and is here for a comprehensive physical exam.  HPI Health Maintenance Due  Topic Date Due   COVID-19 Vaccine (4 - 2023-24 season) 10/05/2022   No chief complaint on file.  Acute Concerns: {ExamConcerns:31114}  Chronic Issues: {ExamConcerns:31114}  Health Maintenance: Immunizations -- *** Colonoscopy -- N/A PSA --  Lab Results  Component Value Date   PSA 0.38 05/11/2014   Diet -- {CPE Diet/Exercise:30649} Sleep habits -- {CPE Mood/Sleep:30650} Exercise -- {CPE Diet/Exercise:30649}  Weight -- Recent weight history Wt Readings from Last 10 Encounters:  09/09/22 253 lb 12.8 oz (115.1 kg)  01/01/22 247 lb 4 oz (112.2 kg)  12/02/21 245 lb 6.1 oz (111.3 kg)  02/10/19 210 lb (95.3 kg)  07/13/17 242 lb 6.4 oz (110 kg)  07/09/17 241 lb (109.3 kg)  06/04/17 241 lb 3.2 oz (109.4 kg)  05/11/17 239 lb 8 oz (108.6 kg)  10/07/16 221 lb (100.2 kg)  09/23/16 222 lb 6.4 oz (100.9 kg)   There is no height or weight on file to calculate BMI.  Mood -- {CPE Mood/Sleep:30650} Alcohol use --  reports current alcohol use.  Tobacco use --  Tobacco Use: High Risk (09/09/2022)   Patient History    Smoking Tobacco Use: Former    Smokeless Tobacco Use: Current    Passive Exposure: Not on file    Eligible for Low Dose CT?  UTD with eye doctor? {Opthamology/Dentistry/Dermatology:30651} UTD with dentist? {Opthamology/Dentistry/Dermatology:30651} Established/UTD with dermatology? - {Opthamology/Dentistry/Dermatology:30651}     01/01/2022    9:50 AM  Depression screen PHQ 2/9  Decreased Interest 1  Down, Depressed, Hopeless 2  PHQ - 2 Score 3  Altered sleeping 1  Tired, decreased energy 3  Change in appetite 2  Feeling bad or failure about yourself  2  Trouble concentrating 2  Moving slowly or fidgety/restless 0  Suicidal thoughts 0  PHQ-9 Score 13  Difficult doing work/chores Somewhat difficult    Other providers/specialists: Patient  Care Team: Jarold Motto, [place] as PCP - General (Physician Assistant) Huston Foley, MD as Attending Physician (Neurology)   PMHx, SurgHx, SocialHx, Medications, and Allergies were reviewed in the Visit Navigator and updated as appropriate.   Past Medical History:  Diagnosis Date   Abdominal pain, unspecified site    Acid reflux    Chest pain    Palpitation    No past surgical history on file. Family History  Problem Relation Age of Onset   Depression Mother    Heart disease Mother 73       scad   Parkinson's disease Mother 14   Heart disease Maternal Grandmother    Hearing loss Maternal Grandmother    Cancer Maternal Grandfather        prostate and bone   Alcohol abuse Paternal Grandmother    COPD Paternal Grandmother    Alcohol abuse Paternal Grandfather    Cancer Paternal Grandfather        lung   Drug abuse Paternal Grandfather    Social History   Tobacco Use   Smoking status: Former   Smokeless tobacco: Current    Types: Snuff   Tobacco comments:    very little, less than a pack per week, socially  Substance Use Topics   Alcohol use: Yes    Comment: 3-4 per week   Drug use: No   Review of Systems:   ROS See pertinent positives and negatives as per the HPI.  Objective:   There were no  vitals filed for this visit.  There is no height or weight on file to calculate BMI.  General  Alert, cooperative, no distress, appears stated age  Head:  Normocephalic, without obvious abnormality, atraumatic  Eyes:  PERRL, conjunctiva/corneas clear, EOM's intact, fundi benign, both eyes       Ears:  Normal TM's and external ear canals, both ears  Nose: Nares normal, septum midline, mucosa normal, no drainage or sinus tenderness  Throat: Lips, mucosa, and tongue normal; teeth and gums normal  Neck: Supple, symmetrical, trachea midline, no adenopathy;     thyroid:  No enlargement/tenderness/nodules; no carotid bruit or JVD  Back:   Symmetric, no curvature, ROM normal,  no CVA tenderness  Lungs:   Clear to auscultation bilaterally, respirations unlabored  Chest wall:  No tenderness or deformity  Heart:  Regular rate and rhythm, S1 and S2 normal, no murmur, rub or gallop  Abdomen:   Soft, non-tender, bowel sounds active all four quadrants, no masses, no organomegaly  Extremities: Extremities normal, atraumatic, no cyanosis or edema  Prostate : ***   Skin: Skin color, texture, turgor normal, no rashes or lesions  Lymph nodes: Cervical, supraclavicular, and axillary nodes normal  Neurologic: CNII-XII grossly intact. Normal strength, sensation and reflexes throughout   AssessmentPlan:   There are no diagnoses linked to this encounter.           I,Emily Lagle,acting as a Neurosurgeon for [employer], PA.,have documented all relevant documentation on the behalf of Jarold Motto, PA,as directed by  Jarold Motto, PA while in the presence of Jarold Motto, [place].  *** (refresh reminder)  I, Jarold Motto, PA, have reviewed all documentation for this visit. The documentation on 01/12/23 for the exam, diagnosis, procedures, and orders are all accurate and complete.  Jarold Motto, PA-C [place] [facility]

## 2023-01-13 ENCOUNTER — Ambulatory Visit (INDEPENDENT_AMBULATORY_CARE_PROVIDER_SITE_OTHER): Payer: Commercial Managed Care - PPO | Admitting: Physician Assistant

## 2023-01-13 ENCOUNTER — Encounter: Payer: Self-pay | Admitting: Physician Assistant

## 2023-01-13 VITALS — BP 136/80 | HR 79 | Temp 97.7°F | Ht 75.0 in | Wt 255.4 lb

## 2023-01-13 DIAGNOSIS — Z0000 Encounter for general adult medical examination without abnormal findings: Secondary | ICD-10-CM

## 2023-01-13 DIAGNOSIS — F4323 Adjustment disorder with mixed anxiety and depressed mood: Secondary | ICD-10-CM

## 2023-01-13 DIAGNOSIS — G4733 Obstructive sleep apnea (adult) (pediatric): Secondary | ICD-10-CM | POA: Diagnosis not present

## 2023-01-13 DIAGNOSIS — E785 Hyperlipidemia, unspecified: Secondary | ICD-10-CM

## 2023-01-13 DIAGNOSIS — E669 Obesity, unspecified: Secondary | ICD-10-CM | POA: Diagnosis not present

## 2023-01-13 LAB — CBC WITH DIFFERENTIAL/PLATELET
Basophils Absolute: 0.1 10*3/uL (ref 0.0–0.1)
Basophils Relative: 0.8 % (ref 0.0–3.0)
Eosinophils Absolute: 0.3 10*3/uL (ref 0.0–0.7)
Eosinophils Relative: 3.4 % (ref 0.0–5.0)
HCT: 46.4 % (ref 39.0–52.0)
Hemoglobin: 15.7 g/dL (ref 13.0–17.0)
Lymphocytes Relative: 26.7 % (ref 12.0–46.0)
Lymphs Abs: 2.2 10*3/uL (ref 0.7–4.0)
MCHC: 33.8 g/dL (ref 30.0–36.0)
MCV: 88.4 fL (ref 78.0–100.0)
Monocytes Absolute: 0.7 10*3/uL (ref 0.1–1.0)
Monocytes Relative: 8.2 % (ref 3.0–12.0)
Neutro Abs: 5 10*3/uL (ref 1.4–7.7)
Neutrophils Relative %: 60.9 % (ref 43.0–77.0)
Platelets: 217 10*3/uL (ref 150.0–400.0)
RBC: 5.25 Mil/uL (ref 4.22–5.81)
RDW: 14.1 % (ref 11.5–15.5)
WBC: 8.2 10*3/uL (ref 4.0–10.5)

## 2023-01-13 LAB — COMPREHENSIVE METABOLIC PANEL
ALT: 58 U/L — ABNORMAL HIGH (ref 0–53)
AST: 37 U/L (ref 0–37)
Albumin: 4.4 g/dL (ref 3.5–5.2)
Alkaline Phosphatase: 63 U/L (ref 39–117)
BUN: 13 mg/dL (ref 6–23)
CO2: 31 meq/L (ref 19–32)
Calcium: 9.1 mg/dL (ref 8.4–10.5)
Chloride: 100 meq/L (ref 96–112)
Creatinine, Ser: 0.81 mg/dL (ref 0.40–1.50)
GFR: 115.14 mL/min (ref >60.00)
Glucose, Bld: 89 mg/dL (ref 70–99)
Potassium: 3.8 meq/L (ref 3.5–5.1)
Sodium: 138 meq/L (ref 135–145)
Total Bilirubin: 0.7 mg/dL (ref 0.2–1.2)
Total Protein: 7.6 g/dL (ref 6.0–8.3)

## 2023-01-13 LAB — LIPID PANEL
Cholesterol: 175 mg/dL (ref 0–200)
HDL: 47.4 mg/dL (ref >39.00)
LDL Cholesterol: 100 mg/dL — ABNORMAL HIGH (ref 0–99)
NonHDL: 128.09
Total CHOL/HDL Ratio: 4
Triglycerides: 140 mg/dL (ref 0.0–149.0)
VLDL: 28 mg/dL (ref 0.0–40.0)

## 2023-01-13 LAB — HEMOGLOBIN A1C: Hgb A1c MFr Bld: 5.6 % (ref 4.6–6.5)

## 2023-01-13 MED ORDER — HYDROXYZINE HCL 10 MG PO TABS: 10.0000 mg | ORAL_TABLET | Freq: Three times a day (TID) | ORAL | 0 refills | Status: AC | PRN

## 2023-01-13 NOTE — Patient Instructions (Signed)
It was great to see you!  Trial the hydroxyzine 10 mg as needed for anxiety -- may increase up to 2-3 tablets as needed  I have submitted referral to get you back with your sleep study provider  Let's follow-up in 1 year, sooner if you have concerns.  Take care,  Jarold Motto PA-C

## 2023-02-13 ENCOUNTER — Other Ambulatory Visit (INDEPENDENT_AMBULATORY_CARE_PROVIDER_SITE_OTHER): Payer: Commercial Managed Care - PPO

## 2023-02-13 DIAGNOSIS — H35711 Central serous chorioretinopathy, right eye: Secondary | ICD-10-CM

## 2023-02-13 LAB — BASIC METABOLIC PANEL
BUN: 13 mg/dL (ref 6–23)
CO2: 29 meq/L (ref 19–32)
Calcium: 8.8 mg/dL (ref 8.4–10.5)
Chloride: 102 meq/L (ref 96–112)
Creatinine, Ser: 0.77 mg/dL (ref 0.40–1.50)
GFR: 116.85 mL/min (ref >60.00)
Glucose, Bld: 81 mg/dL (ref 70–99)
Potassium: 4 meq/L (ref 3.5–5.1)
Sodium: 138 meq/L (ref 135–145)

## 2023-02-16 ENCOUNTER — Other Ambulatory Visit: Payer: Self-pay | Admitting: Physician Assistant

## 2023-02-16 ENCOUNTER — Encounter: Payer: Self-pay | Admitting: Physician Assistant

## 2023-02-16 DIAGNOSIS — R748 Abnormal levels of other serum enzymes: Secondary | ICD-10-CM

## 2023-02-16 NOTE — Telephone Encounter (Signed)
 Hello, I checked with the Lab it can not be added on. He will need to come back.

## 2023-02-19 ENCOUNTER — Encounter: Payer: Self-pay | Admitting: Neurology

## 2023-02-19 ENCOUNTER — Ambulatory Visit: Payer: Commercial Managed Care - PPO | Admitting: Neurology

## 2023-02-19 VITALS — BP 128/76 | HR 82 | Ht 75.0 in | Wt 254.0 lb

## 2023-02-19 DIAGNOSIS — G4733 Obstructive sleep apnea (adult) (pediatric): Secondary | ICD-10-CM

## 2023-02-19 DIAGNOSIS — E66811 Obesity, class 1: Secondary | ICD-10-CM

## 2023-02-19 DIAGNOSIS — R635 Abnormal weight gain: Secondary | ICD-10-CM | POA: Diagnosis not present

## 2023-02-19 DIAGNOSIS — G4719 Other hypersomnia: Secondary | ICD-10-CM | POA: Diagnosis not present

## 2023-02-19 NOTE — Progress Notes (Signed)
Subjective:    Patient ID: Jonathan Spencer is a 33 y.o. male.  HPI    Huston Foley, MD, PhD [facility] [address] [address] [place], [place] [address]  Dear Jonathan Spencer,  I saw your patient, Jonathan Spencer, upon your kind request in my sleep clinic today for evaluation of his obstructive sleep apnea.  The patient is unaccompanied today.  As you know, [name] is a 33 year old male with an underlying medical history of reflux disease, anxiety, palpitations, chest pain and obesity, who was previously diagnosed with severe obstructive sleep apnea.  His Epworth sleepiness score is 10 out of 24, fatigue severity score is 40 out of 63. I was able to review his most recent CPAP compliance data.  He is a ResMed air sense 10 CPAP machine with a set pressure of 10 cm without EPR.  He has used his machine every night between 01/20/2023 and 02/18/2023 with percent use days greater than 4 hours at 100%, indicating superb compliance, residual AHI at goal at 1.2/h, leak on the low side with the 95th percentile at 0.6 L/min.  Average usage of 6 hours and 45 minutes.  He gets his supplies online.  He uses a ResMed F20 AirTouch fullface mask.  He denies recurrent morning headaches or nocturnal headaches or nightly nocturia.  He lives with his family including wife and 3 other children, ages 61, 15 and 1-1/2.  Bedtime is between 11 and midnight and rise time between 6 and 6:30 AM.  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  He drinks caffeine in the form of coffee, up to 2 cups in the morning and 3-4 diet sodas per day, as late as 9 PM.  He quit smoking cigarettes and vaping about 4+ years ago.  He drinks alcohol about 4 times a week, up to 3 drinks.  He has reduced his alcohol consumption.    He continues to benefit from his CPAP and would like to get a new machine.    I reviewed your office note from 01/13/2023.  He has had some interim weight gain since his last  sleep study. Of note, he had a split-night sleep study through our sleep lab on 08/02/2017, which showed a baseline AHI of 37.4/h, O2 nadir 83%.  He did well with CPAP of 9 cm via full facemask and was advised to start home CPAP therapy.  He was lost to follow-up since then.  His weight was 241 pounds at the time. He has had some interim weight gain in the realm of 10 pounds.  Previously:  07/09/17: 33 year old right-handed gentleman with an underlying medical history of reflux disease, history of chest pain and palpitations as well as borderline obesity, who reports snoring and excessive daytime somnolence. He has witnessed apneas. His Epworth sleepiness score is 14 out of 24 today, fatigue score is 45 out of 63. He lives with his fiance, he has 1 child. He works for [employer]. He uses smokeless tobacco, drinks alcohol 1-2 drinks per day on average, caffeine in the form of diet soda about 3-4 per day on average. I reviewed your office note from 05/11/2017. His bedtime is generally between 11 and 11:30. He does not have nighttime nocturia and denies morning headaches. He does have early rise time because his 84-month-old daughter wakes up between 5 and 6. Generally, he could sleep until 7. His father has sleep apnea who was diagnosed about 20 years ago. He  is supposed to be on a CPAP but could not tolerate it at the time. Patient has occasional symptoms of restless legs during the day, is not aware of any leg movements at night. His snoring has been loud enough to disturb his fiance. He tried an over-the-counter mouth piece for sleep apnea treatment but could not tolerate it. His weight has increased in the past few years.     His Past Medical History Is Significant For: Past Medical History:  Diagnosis Date   Abdominal pain, unspecified site    Acid reflux    Chest pain    Palpitation     His Past Surgical History Is Significant For: Past Surgical History:  Procedure Laterality Date    VASECTOMY  10/06/2022    His Family History Is Significant For: Family History  Problem Relation Age of Onset   Depression Mother    Heart attack Mother 36   Parkinson's disease Mother 75   Atrial fibrillation Father    Benign prostatic hyperplasia Father        partial prostatectomy   Heart disease Maternal Grandmother    Hearing loss Maternal Grandmother    Cancer Maternal Grandfather        prostate mets to bone   Alcohol abuse Paternal Grandmother    COPD Paternal Grandmother    Alcohol abuse Paternal Grandfather    Cancer Paternal Grandfather        lung    His Social History Is Significant For: Social History   Socioeconomic History   Marital status: Married    Spouse name: Not on file   Number of children: Not on file   Years of education: Not on file   Highest education level: Some college, no degree  Occupational History   Occupation: Community education officer    Comment: Airline pilot, Forensic psychologist   Tobacco Use   Smoking status: Former   Smokeless tobacco: Current    Types: Snuff   Tobacco comments:    very little, less than a pack per week, socially  Substance and Sexual Activity   Alcohol use: Yes    Comment: 3-4 per week   Drug use: No   Sexual activity: Yes  Other Topics Concern   Not on file  Social History Narrative   Married   Works in Programmer, applications   3 children under 61 years old (2023)   Social Drivers of Corporate investment banker Strain: Low Risk  (09/08/2022)   Overall Financial Resource Strain (CARDIA)    Difficulty of Paying Living Expenses: Not very hard  Food Insecurity: No Food Insecurity (09/08/2022)   Hunger Vital Sign    Worried About Running Out of Food in the Last Year: Never true    Ran Out of Food in the Last Year: Never true  Transportation Needs: No Transportation Needs (09/08/2022)   PRAPARE - Administrator, Civil Service (Medical): No    Lack of Transportation (Non-Medical): No  Physical Activity: Insufficiently  Active (09/08/2022)   Exercise Vital Sign    Days of Exercise per Week: 3 days    Minutes of Exercise per Session: 30 min  Stress: Stress Concern Present (09/08/2022)   [employer] of Occupational Health - Occupational Stress Questionnaire    Feeling of Stress : Rather much  Social Connections: Moderately Integrated (09/08/2022)   Social Connection and Isolation Panel [NHANES]    Frequency of Communication with Friends and Family: Twice a week    Frequency of  Social Gatherings with Friends and Family: Twice a week    Attends Religious Services: 1 to 4 times per year    Active Member of [employer] or Organizations: No    Attends Engineer, structural: Not on file    Marital Status: Married    His Allergies Are:  No Known Allergies:   His Current Medications Are:  Outpatient Encounter Medications as of 02/19/2023  Medication Sig   Calcium Carb-Cholecalciferol (CALCIUM 500 + D PO) Take 1 tablet by mouth daily in the afternoon.   hydrOXYzine (ATARAX) 10 MG tablet Take 1 tablet (10 mg total) by mouth 3 (three) times daily as needed.   Magnesium 400 MG TABS Take 1 tablet by mouth daily in the afternoon.   omeprazole (PRILOSEC OTC) 20 MG tablet Take 1 tablet (20 mg total) by mouth daily.   Specialty Vitamins Products (BIOTIN PLUS KERATIN PO) Take 1 tablet by mouth daily in the afternoon.   Turmeric 500 MG CAPS Take 1 capsule by mouth daily in the afternoon.   [DISCONTINUED] busPIRone (BUSPAR) 10 MG tablet TAKE 1 TABLET BY MOUTH TWICE A DAY (Patient taking differently: Take 10 mg by mouth as needed.)   No facility-administered encounter medications on file as of 02/19/2023.  :   Review of Systems:  Out of a complete 14 point review of systems, all are reviewed and negative with the exception of these symptoms as listed below:  Review of Systems  Neurological:        Rm 9 alone Pt is well, reports he gets about 6-7 hrs of sleep a night. He has been tolerating CPAP well. He is needing  a new machine. Has had current machine since 2019.     Objective:  Neurological Exam  Physical Exam Physical Examination:   Vitals:   02/19/23 1027  BP: 128/76  Pulse: 82    General Examination: The patient is a very pleasant 33 y.o. male in no acute distress. He appears well-developed and well-nourished and well groomed.   HEENT: Normocephalic, atraumatic, pupils are equal, round and reactive to light, extraocular tracking is good without limitation to gaze excursion or nystagmus noted. Hearing is grossly intact. Face is symmetric with normal facial animation. Speech is clear with no dysarthria noted. There is no hypophonia. Neck is supple with full range of passive and active motion. There are no carotid bruits on auscultation. Oropharynx exam reveals: mild mouth dryness, good dental hygiene and moderate airway crowding, due to tonsillar size of about 1-2+, Mallampati class II, prominent uvula.  Tongue protrudes centrally and palate elevates symmetrically, neck circumference 17-7/8 inches.  Mild overbite noted.  Intermittent jaw trembling noted.  Chest: Clear to auscultation without wheezing, rhonchi or crackles noted.  Heart: S1+S2+0, regular and normal without murmurs, rubs or gallops noted.   Abdomen: Soft, non-tender and non-distended.  Extremities: There is no pitting edema in the distal lower extremities bilaterally.   Skin: Warm and dry without trophic changes noted.   Musculoskeletal: exam reveals no obvious joint deformities.   Neurologically:  Mental status: The patient is awake, alert and oriented in all 4 spheres. His immediate and remote memory, attention, language skills and fund of knowledge are appropriate. There is no evidence of aphasia, agnosia, apraxia or anomia. Speech is clear with normal prosody and enunciation. Thought process is linear. Mood is normal and affect is normal.  Cranial nerves II - XII are as described above under HEENT exam.  Motor exam:  Normal bulk, strength and  tone is noted. There is no obvious action or resting tremor.  Fine motor skills and coordination: grossly intact.  Cerebellar testing: No dysmetria or intention tremor. There is no truncal or gait ataxia.  Sensory exam: intact to light touch in the upper and lower extremities.  Gait, station and balance: He stands easily. No veering to one side is noted. No leaning to one side is noted. Posture is age-appropriate and stance is narrow based. Gait shows normal stride length and normal pace. No problems turning are noted.   Assessment and Plan:  In summary, Koleman Mckinnies is a very pleasant 33 y.o.-year old male with an underlying medical history of reflux disease, anxiety, palpitations, chest pain and obesity, who presents for evaluation of his obstructive sleep apnea.  He was diagnosed with severe obstructive sleep apnea over 5 years ago.  He has been on CPAP therapy with good compliance and benefit, has had some weight fluctuation, and would like to get a new machine.  We will proceed with reevaluation in the form of a home sleep test.  I explained the test procedure to the patient and reminded him not to use his PAP machine when getting tested at home.  Otherwise, he is advised to be consistent with his CPAP therapy, settings look good, apnea control excellent, compliance superb.  We will plan to follow-up after testing.  We talked about potential alternative treatment options but mutually agreed to pursue ongoing PAP therapy after testing.  We will plan compliance with it within 2 to 3 months after set up on his new equipment. I explained the the risks and ramifications of untreated moderate to severe OSA, especially with respect to developing cardiovascular disease down the road, including congestive heart failure (CHF), difficult to treat hypertension, cardiac arrhythmias (particularly A-fib), neurovascular complications including TIA, stroke and dementia. Even type 2  diabetes has, in part, been linked to untreated OSA. Symptoms of untreated OSA may include (but may not be limited to) daytime sleepiness, nocturia (i.e. frequent nighttime urination), memory problems, mood irritability and suboptimally controlled or worsening mood disorder such as depression and/or anxiety, lack of energy, lack of motivation, physical discomfort, as well as recurrent headaches, especially morning or nocturnal headaches. We talked about the importance of maintaining a healthy lifestyle and striving for healthy weight.  The importance of complete smoking cessation was also addressed.  In addition, we talked about the importance of striving for and maintaining good sleep hygiene, particularly making enough time for sleep. I answered all his questions today and he was in agreement with our plan.   Thank you very much for allowing me to participate in the care of this nice patient. If I can be of any further assistance to you please do not hesitate to call me at [phone].  Sincerely,   Huston Foley, MD, PhD

## 2023-02-19 NOTE — Patient Instructions (Signed)
I will order a home sleep test for re-evaluation of your sleep apnea, as testing was in 2019. The home sleep test is done (HST) to re-establish/confirm the sleep apnea diagnosis and to get you a new machine through the insurance. Our sleep lab staff will reach out to you to arrange for pickup and for tutorial of the test equipment. I will write for a new machine after the HST confirms the OSA (obstructive sleep apnea) diagnosis. Please be reminded not use the current CPAP the night of testing, so we get diagnostic data, not treatment data. We will schedule a follow-up appointment after the new machine set-up date, typically within 31 to 89 days post treatment start. You will need to show compliance with usage and fulfill a minimum usage percentage, which is an insurance requirement.

## 2023-03-30 ENCOUNTER — Ambulatory Visit: Payer: Commercial Managed Care - PPO | Admitting: Neurology

## 2023-03-30 DIAGNOSIS — G4719 Other hypersomnia: Secondary | ICD-10-CM

## 2023-03-30 DIAGNOSIS — E66811 Obesity, class 1: Secondary | ICD-10-CM

## 2023-03-30 DIAGNOSIS — G4733 Obstructive sleep apnea (adult) (pediatric): Secondary | ICD-10-CM

## 2023-03-30 DIAGNOSIS — R635 Abnormal weight gain: Secondary | ICD-10-CM

## 2023-04-15 NOTE — Addendum Note (Signed)
 Addended by: Huston Foley on: 04/15/2023 05:46 PM   Modules accepted: Orders

## 2023-04-15 NOTE — Procedures (Signed)
 [facility]  HOME SLEEP TEST (SANSA) REPORT (Mail-Out Device):   STUDY DATE: 03/30/23  DOB: 1990-07-29  MRN: [id number]  ORDERING CLINICIAN: Huston Foley, MD, PhD   REFERRING CLINICIAN: Jarold Motto, [place]   CLINICAL INFORMATION/HISTORY: 33 year old male with an underlying medical history of reflux disease, anxiety, palpitations, chest pain and obesity, who was previously diagnosed with severe obstructive sleep apnea. He has been compliant with his CPAP of 10 cm without EPR with good tolerance, adequate apnea control and ongoing benefit for years. He should qualify for a new machine.   PATIENT'S LAST REPORTED EPWORTH SLEEPINESS SCORE (ESS): 10/24.  BMI (at the time of sleep clinic visit and/or test date): 31.8 kg/m  FINDINGS:   Study Protocol:    The SANSA single-point-of-skin-contact chest-worn sensor - an FDA cleared and DOT approved type 4 home sleep test device - measures eight physiological channels,  including blood oxygen saturation (measured via PPG [photoplethysmography]), EKG-derived heart rate, respiratory effort, chest movement (measured via accelerometer), snoring, body position, and actigraphy. The device is designed to be worn for up to 10 hours per study.   Sleep Summary:   Total Recording Time (hours, min): 7 hours, 40 min  Total Effective Sleep Time (hours, min):  5 hours, 46 min  Sleep Efficiency (%):    75%   Respiratory Indices:   Calculated sAHI (per hour):  26.6/hour         Oxygen Saturation Statistics:    Oxygen Saturation (%) Mean: 94.8%   Minimum oxygen saturation (%):                 77.5%   O2 Saturation Range (%): 77.5 - 99.8%   Time below or at 88% saturation: 7 min   Pulse Rate Statistics:   Pulse Mean (bpm):    68/min    Pulse Range (53 - 103/min)   Snoring: mild to moderate   IMPRESSION/DIAGNOSES:   OSA (obstructive sleep apnea), moderate    RECOMMENDATIONS:   This home sleep test demonstrates  moderate obstructive sleep apnea with a total AHI of 26.6/hour and O2 nadir of 77.5%. Mild to moderate snoring was detected. Ongoing treatment with a positive airway pressure (PAP) device is recommended. The patient has been compliant with his CPAP of 10 cm without EPR with good tolerance, adequate apnea control and ongoing benefit reported. He should qualify for a new machine, which I will prescribe. A full night titration study may be considered to optimize treatment settings, monitor proper oxygen saturations and aid with improvement of tolerance and adherence, if needed down the road. Alternative treatment options may include a dental device through dentistry or orthodontics in selected patients or Inspire (hypoglossal nerve stimulator) in carefully selected patients (meeting inclusion criteria).  Concomitant weight loss is recommended (where clinically appropriate). Please note that untreated obstructive sleep apnea may carry additional perioperative morbidity. Patients with significant obstructive sleep apnea should receive perioperative PAP therapy and the surgeons and particularly the anesthesiologist should be informed of the diagnosis and the severity of the sleep disordered breathing. The patient should be cautioned not to drive, work at heights, or operate dangerous or heavy equipment when tired or sleepy. Review and reiteration of good sleep hygiene measures should be pursued with any patient. Other causes of the patient's symptoms, including circadian rhythm disturbances, an underlying mood disorder, medication effect and/or an underlying medical problem cannot be ruled out based on this test. Clinical correlation is recommended.  The patient and his referring provider will be  notified of the test results. The patient will be seen in follow up in sleep clinic at [facility].  I certify that I have reviewed the raw data recording prior to the issuance of this report in accordance with the standards of the  American Academy of Sleep Medicine (AASM).''   INTERPRETING PHYSICIAN:   Huston Foley, MD, PhD Medical Director, [facility]) Diplomat, ABPN (Neurology and Sleep)   [facility] [address], [address] [place], [place] [address] [phone]

## 2023-04-15 NOTE — Progress Notes (Signed)
 See procedure note.

## 2023-04-16 ENCOUNTER — Telehealth: Payer: Self-pay | Admitting: *Deleted

## 2023-04-16 NOTE — Telephone Encounter (Signed)
I called and LMVM for pt to return call for sleep study results.

## 2023-04-16 NOTE — Telephone Encounter (Signed)
-----   Message from Jonathan Spencer sent at 04/15/2023  5:46 PM EDT ----- Patient referred by PCP for re-eval of his OSA. He should be able to get a new machine. I saw him on 02/19/23, he had a HST on 03/30/23.    Please call and notify the patient that the recent home sleep test showed obstructive sleep apnea in the moderate range. I recommend ongoing treatment w CPAP and will write for a new machine. We can send the order to a supplier of his choice, usually to a local DME company. We will need a FU in sleep clinic for 10 weeks post-PAP set up, please arrange that with me or one of our NPs. Also reinforce the need for ongoing excellent compliance with treatment. Thanks,   Jonathan Foley, MD, PhD [facility])

## 2023-04-17 NOTE — Telephone Encounter (Signed)
 Informed pt of their sleep study results. The study showed moderated sleep apnea . Per Dr. Teofilo Pod recommendations, the pt was advised to treatment with Auto CPAP. Pt verbalized understanding and would like to talk with his wife about what DME company to go with and he would give [place] a call back. Pt had no questions at this time but was encouraged to call back if questions arise.

## 2023-04-24 NOTE — Telephone Encounter (Signed)
 Pt called  wanting his cpap order sent to [facility].

## 2023-04-28 NOTE — Telephone Encounter (Signed)
 Called and LMVM for pt that wanted to confirm to send to [facility].   I went ahead and sent community message to matt with [facility] on this pt.  Awaiting confirmation received.

## 2023-04-29 NOTE — Telephone Encounter (Signed)
 I called [facility].  Spoke with Liborio Nixon.  She did not have pt, so will email Jeanella Flattery, who is out in the filed.  I emailed him so waiting response. I also went ahead and faxed to him [facility].  [phone] 17pgs.  [phone] ofv.  Received confirmation.

## 2023-04-30 NOTE — Telephone Encounter (Signed)
 Maxwell Caul, RN Thanks Dois Davenport!  I went ahead and grabbed everything and sent it in also.     Previous Messages    ----- Message ----- From: Guy Begin, RN Sent: 04/28/2023  10:20 AM EDT To: Jeanella Flattery Subject: new cpap user                                  Beverely Low,  New user and order in [place],  Brett Albino. [address]" Male, 33 y.o., 1990-08-21 MRN: [id number] Phone: [phone] Judie Petit)   Thanks  Andrey Campanile RN

## 2023-05-04 NOTE — Telephone Encounter (Signed)
 Received fax from [facility] that they did receive referral. [phone].

## 2024-01-15 ENCOUNTER — Encounter: Payer: Commercial Managed Care - PPO | Admitting: Physician Assistant
